# Patient Record
Sex: Female | Born: 1935 | Race: White | Hispanic: No | Smoking: Former smoker
Health system: Southern US, Community
[De-identification: ages and names within clinical notes are randomized; demographics above are authoritative.]

## PROBLEM LIST (undated history)

## (undated) DIAGNOSIS — R519 Headache, unspecified: Secondary | ICD-10-CM

## (undated) DIAGNOSIS — J45909 Unspecified asthma, uncomplicated: Secondary | ICD-10-CM

## (undated) DIAGNOSIS — R51 Headache: Secondary | ICD-10-CM

## (undated) DIAGNOSIS — I2510 Atherosclerotic heart disease of native coronary artery without angina pectoris: Secondary | ICD-10-CM

## (undated) DIAGNOSIS — I340 Nonrheumatic mitral (valve) insufficiency: Secondary | ICD-10-CM

## (undated) DIAGNOSIS — E785 Hyperlipidemia, unspecified: Secondary | ICD-10-CM

## (undated) DIAGNOSIS — G8929 Other chronic pain: Secondary | ICD-10-CM

## (undated) DIAGNOSIS — I10 Essential (primary) hypertension: Secondary | ICD-10-CM

## (undated) DIAGNOSIS — I639 Cerebral infarction, unspecified: Secondary | ICD-10-CM

## (undated) HISTORY — DX: Essential (primary) hypertension: I10

## (undated) HISTORY — PX: BACK SURGERY: SHX140

## (undated) HISTORY — DX: Other chronic pain: G89.29

## (undated) HISTORY — DX: Headache: R51

## (undated) HISTORY — DX: Atherosclerotic heart disease of native coronary artery without angina pectoris: I25.10

## (undated) HISTORY — PX: ROTATOR CUFF REPAIR: SHX139

## (undated) HISTORY — DX: Nonrheumatic mitral (valve) insufficiency: I34.0

## (undated) HISTORY — DX: Headache, unspecified: R51.9

## (undated) HISTORY — DX: Cerebral infarction, unspecified: I63.9

## (undated) HISTORY — DX: Unspecified asthma, uncomplicated: J45.909

## (undated) HISTORY — DX: Hyperlipidemia, unspecified: E78.5

---
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
## 1971-04-23 HISTORY — PX: PARTIAL HYSTERECTOMY: SHX80

## 1992-04-22 HISTORY — PX: CHOLECYSTECTOMY: SHX55

## 1997-12-12 ENCOUNTER — Ambulatory Visit (HOSPITAL_BASED_OUTPATIENT_CLINIC_OR_DEPARTMENT_OTHER): Admission: RE | Admit: 1997-12-12 | Discharge: 1997-12-12 | Payer: Self-pay | Admitting: Orthopedic Surgery

## 1998-09-19 ENCOUNTER — Encounter: Payer: Self-pay | Admitting: Neurosurgery

## 1998-09-21 ENCOUNTER — Inpatient Hospital Stay (HOSPITAL_COMMUNITY): Admission: RE | Admit: 1998-09-21 | Discharge: 1998-09-22 | Payer: Self-pay | Admitting: Neurosurgery

## 1998-09-21 ENCOUNTER — Encounter: Payer: Self-pay | Admitting: Neurosurgery

## 1998-09-26 ENCOUNTER — Encounter: Payer: Self-pay | Admitting: Neurosurgery

## 1998-09-26 ENCOUNTER — Inpatient Hospital Stay (HOSPITAL_COMMUNITY): Admission: AD | Admit: 1998-09-26 | Discharge: 1998-09-29 | Payer: Self-pay | Admitting: Neurosurgery

## 1998-09-28 ENCOUNTER — Encounter: Payer: Self-pay | Admitting: Neurosurgery

## 1999-07-06 ENCOUNTER — Encounter: Admission: RE | Admit: 1999-07-06 | Discharge: 1999-10-02 | Payer: Self-pay | Admitting: Anesthesiology

## 1999-10-02 ENCOUNTER — Encounter: Admission: RE | Admit: 1999-10-02 | Discharge: 1999-12-31 | Payer: Self-pay | Admitting: Anesthesiology

## 2000-01-01 ENCOUNTER — Encounter: Admission: RE | Admit: 2000-01-01 | Discharge: 2000-03-31 | Payer: Self-pay | Admitting: Anesthesiology

## 2000-10-06 ENCOUNTER — Ambulatory Visit (HOSPITAL_COMMUNITY): Admission: RE | Admit: 2000-10-06 | Discharge: 2000-10-06 | Payer: Self-pay | Admitting: *Deleted

## 2000-10-06 ENCOUNTER — Encounter: Payer: Self-pay | Admitting: *Deleted

## 2000-11-25 ENCOUNTER — Ambulatory Visit (HOSPITAL_COMMUNITY): Admission: RE | Admit: 2000-11-25 | Discharge: 2000-11-25 | Payer: Self-pay | Admitting: Ophthalmology

## 2001-01-09 ENCOUNTER — Encounter: Payer: Self-pay | Admitting: Emergency Medicine

## 2001-01-09 ENCOUNTER — Emergency Department (HOSPITAL_COMMUNITY): Admission: EM | Admit: 2001-01-09 | Discharge: 2001-01-09 | Payer: Self-pay | Admitting: Emergency Medicine

## 2001-02-03 ENCOUNTER — Ambulatory Visit (HOSPITAL_COMMUNITY): Admission: RE | Admit: 2001-02-03 | Discharge: 2001-02-03 | Payer: Self-pay | Admitting: Ophthalmology

## 2001-04-02 ENCOUNTER — Ambulatory Visit (HOSPITAL_COMMUNITY): Admission: RE | Admit: 2001-04-02 | Discharge: 2001-04-02 | Payer: Self-pay | Admitting: Cardiology

## 2001-05-02 ENCOUNTER — Emergency Department (HOSPITAL_COMMUNITY): Admission: EM | Admit: 2001-05-02 | Discharge: 2001-05-02 | Payer: Self-pay | Admitting: Emergency Medicine

## 2001-05-20 ENCOUNTER — Encounter: Payer: Self-pay | Admitting: Family Medicine

## 2001-05-20 ENCOUNTER — Ambulatory Visit (HOSPITAL_COMMUNITY): Admission: RE | Admit: 2001-05-20 | Discharge: 2001-05-20 | Payer: Self-pay | Admitting: Family Medicine

## 2001-05-26 ENCOUNTER — Ambulatory Visit (HOSPITAL_COMMUNITY): Admission: RE | Admit: 2001-05-26 | Discharge: 2001-05-26 | Payer: Self-pay | Admitting: Family Medicine

## 2001-05-26 ENCOUNTER — Encounter: Payer: Self-pay | Admitting: Family Medicine

## 2001-06-10 ENCOUNTER — Ambulatory Visit (HOSPITAL_COMMUNITY): Admission: RE | Admit: 2001-06-10 | Discharge: 2001-06-10 | Payer: Self-pay | Admitting: Internal Medicine

## 2001-08-03 ENCOUNTER — Ambulatory Visit (HOSPITAL_COMMUNITY): Admission: RE | Admit: 2001-08-03 | Discharge: 2001-08-03 | Payer: Self-pay | Admitting: Internal Medicine

## 2001-08-03 ENCOUNTER — Encounter (INDEPENDENT_AMBULATORY_CARE_PROVIDER_SITE_OTHER): Payer: Self-pay | Admitting: Internal Medicine

## 2001-10-13 ENCOUNTER — Encounter: Payer: Self-pay | Admitting: Family Medicine

## 2001-10-13 ENCOUNTER — Ambulatory Visit (HOSPITAL_COMMUNITY): Admission: RE | Admit: 2001-10-13 | Discharge: 2001-10-13 | Payer: Self-pay | Admitting: Family Medicine

## 2002-02-05 ENCOUNTER — Emergency Department (HOSPITAL_COMMUNITY): Admission: EM | Admit: 2002-02-05 | Discharge: 2002-02-05 | Payer: Self-pay | Admitting: Internal Medicine

## 2002-02-12 ENCOUNTER — Emergency Department (HOSPITAL_COMMUNITY): Admission: EM | Admit: 2002-02-12 | Discharge: 2002-02-12 | Payer: Self-pay | Admitting: Emergency Medicine

## 2002-02-12 ENCOUNTER — Encounter: Payer: Self-pay | Admitting: Emergency Medicine

## 2002-02-12 ENCOUNTER — Inpatient Hospital Stay (HOSPITAL_COMMUNITY): Admission: EM | Admit: 2002-02-12 | Discharge: 2002-02-16 | Payer: Self-pay | Admitting: *Deleted

## 2002-02-13 ENCOUNTER — Encounter: Payer: Self-pay | Admitting: *Deleted

## 2002-10-26 ENCOUNTER — Emergency Department (HOSPITAL_COMMUNITY): Admission: EM | Admit: 2002-10-26 | Discharge: 2002-10-26 | Payer: Self-pay | Admitting: *Deleted

## 2002-10-26 ENCOUNTER — Encounter: Payer: Self-pay | Admitting: *Deleted

## 2002-11-25 ENCOUNTER — Inpatient Hospital Stay (HOSPITAL_COMMUNITY): Admission: AD | Admit: 2002-11-25 | Discharge: 2002-11-30 | Payer: Self-pay | Admitting: Family Medicine

## 2002-11-25 ENCOUNTER — Encounter: Payer: Self-pay | Admitting: Family Medicine

## 2002-11-26 ENCOUNTER — Encounter: Payer: Self-pay | Admitting: Family Medicine

## 2002-11-30 ENCOUNTER — Inpatient Hospital Stay: Admission: AD | Admit: 2002-11-30 | Discharge: 2002-12-08 | Payer: Self-pay | Admitting: Family Medicine

## 2003-06-19 ENCOUNTER — Emergency Department (HOSPITAL_COMMUNITY): Admission: EM | Admit: 2003-06-19 | Discharge: 2003-06-19 | Payer: Self-pay | Admitting: Emergency Medicine

## 2003-06-20 ENCOUNTER — Emergency Department (HOSPITAL_COMMUNITY): Admission: EM | Admit: 2003-06-20 | Discharge: 2003-06-20 | Payer: Self-pay | Admitting: Emergency Medicine

## 2003-06-30 ENCOUNTER — Emergency Department (HOSPITAL_COMMUNITY): Admission: EM | Admit: 2003-06-30 | Discharge: 2003-06-30 | Payer: Self-pay | Admitting: Emergency Medicine

## 2003-07-01 ENCOUNTER — Emergency Department (HOSPITAL_COMMUNITY): Admission: EM | Admit: 2003-07-01 | Discharge: 2003-07-01 | Payer: Self-pay | Admitting: Emergency Medicine

## 2003-07-03 ENCOUNTER — Inpatient Hospital Stay (HOSPITAL_COMMUNITY): Admission: EM | Admit: 2003-07-03 | Discharge: 2003-07-11 | Payer: Self-pay | Admitting: Emergency Medicine

## 2003-07-11 ENCOUNTER — Inpatient Hospital Stay: Admission: AD | Admit: 2003-07-11 | Discharge: 2003-07-15 | Payer: Self-pay | Admitting: Family Medicine

## 2003-07-15 ENCOUNTER — Inpatient Hospital Stay (HOSPITAL_COMMUNITY): Admission: EM | Admit: 2003-07-15 | Discharge: 2003-07-19 | Payer: Self-pay | Admitting: Emergency Medicine

## 2003-07-19 ENCOUNTER — Inpatient Hospital Stay: Admission: AD | Admit: 2003-07-19 | Discharge: 2003-07-27 | Payer: Self-pay | Admitting: Family Medicine

## 2003-08-08 ENCOUNTER — Emergency Department (HOSPITAL_COMMUNITY): Admission: EM | Admit: 2003-08-08 | Discharge: 2003-08-08 | Payer: Self-pay | Admitting: Emergency Medicine

## 2003-08-15 ENCOUNTER — Inpatient Hospital Stay (HOSPITAL_COMMUNITY): Admission: EM | Admit: 2003-08-15 | Discharge: 2003-08-17 | Payer: Self-pay | Admitting: Emergency Medicine

## 2003-11-21 ENCOUNTER — Ambulatory Visit (HOSPITAL_COMMUNITY): Admission: RE | Admit: 2003-11-21 | Discharge: 2003-11-21 | Payer: Self-pay | Admitting: Family Medicine

## 2004-02-10 ENCOUNTER — Ambulatory Visit (HOSPITAL_COMMUNITY): Admission: RE | Admit: 2004-02-10 | Discharge: 2004-02-10 | Payer: Self-pay | Admitting: Internal Medicine

## 2004-02-27 ENCOUNTER — Ambulatory Visit: Payer: Self-pay | Admitting: Urgent Care

## 2004-04-06 ENCOUNTER — Emergency Department (HOSPITAL_COMMUNITY): Admission: EM | Admit: 2004-04-06 | Discharge: 2004-04-07 | Payer: Self-pay | Admitting: Emergency Medicine

## 2004-05-26 ENCOUNTER — Emergency Department (HOSPITAL_COMMUNITY): Admission: EM | Admit: 2004-05-26 | Discharge: 2004-05-26 | Payer: Self-pay | Admitting: Emergency Medicine

## 2004-06-06 ENCOUNTER — Ambulatory Visit: Payer: Self-pay | Admitting: Internal Medicine

## 2005-01-10 IMAGING — CR DG CHEST 1V PORT
1 series · 1 of 1 positions shown · non-contrast
Comparison: none

CLINICAL DATA: Chest pain.
 PORTABLE CHEST ONE VIEW
 Portable exam, 0547 hours compared to 07/03/03.  Normal heart size, mediastinal contours, and vascularity.  No acute infiltrate, effusion, or mass.  Mild calcification and elongation of aorta.  Degenerative changes left AC joint. 
 IMPRESSION
 No acute abnormalities.

[view not recorded]
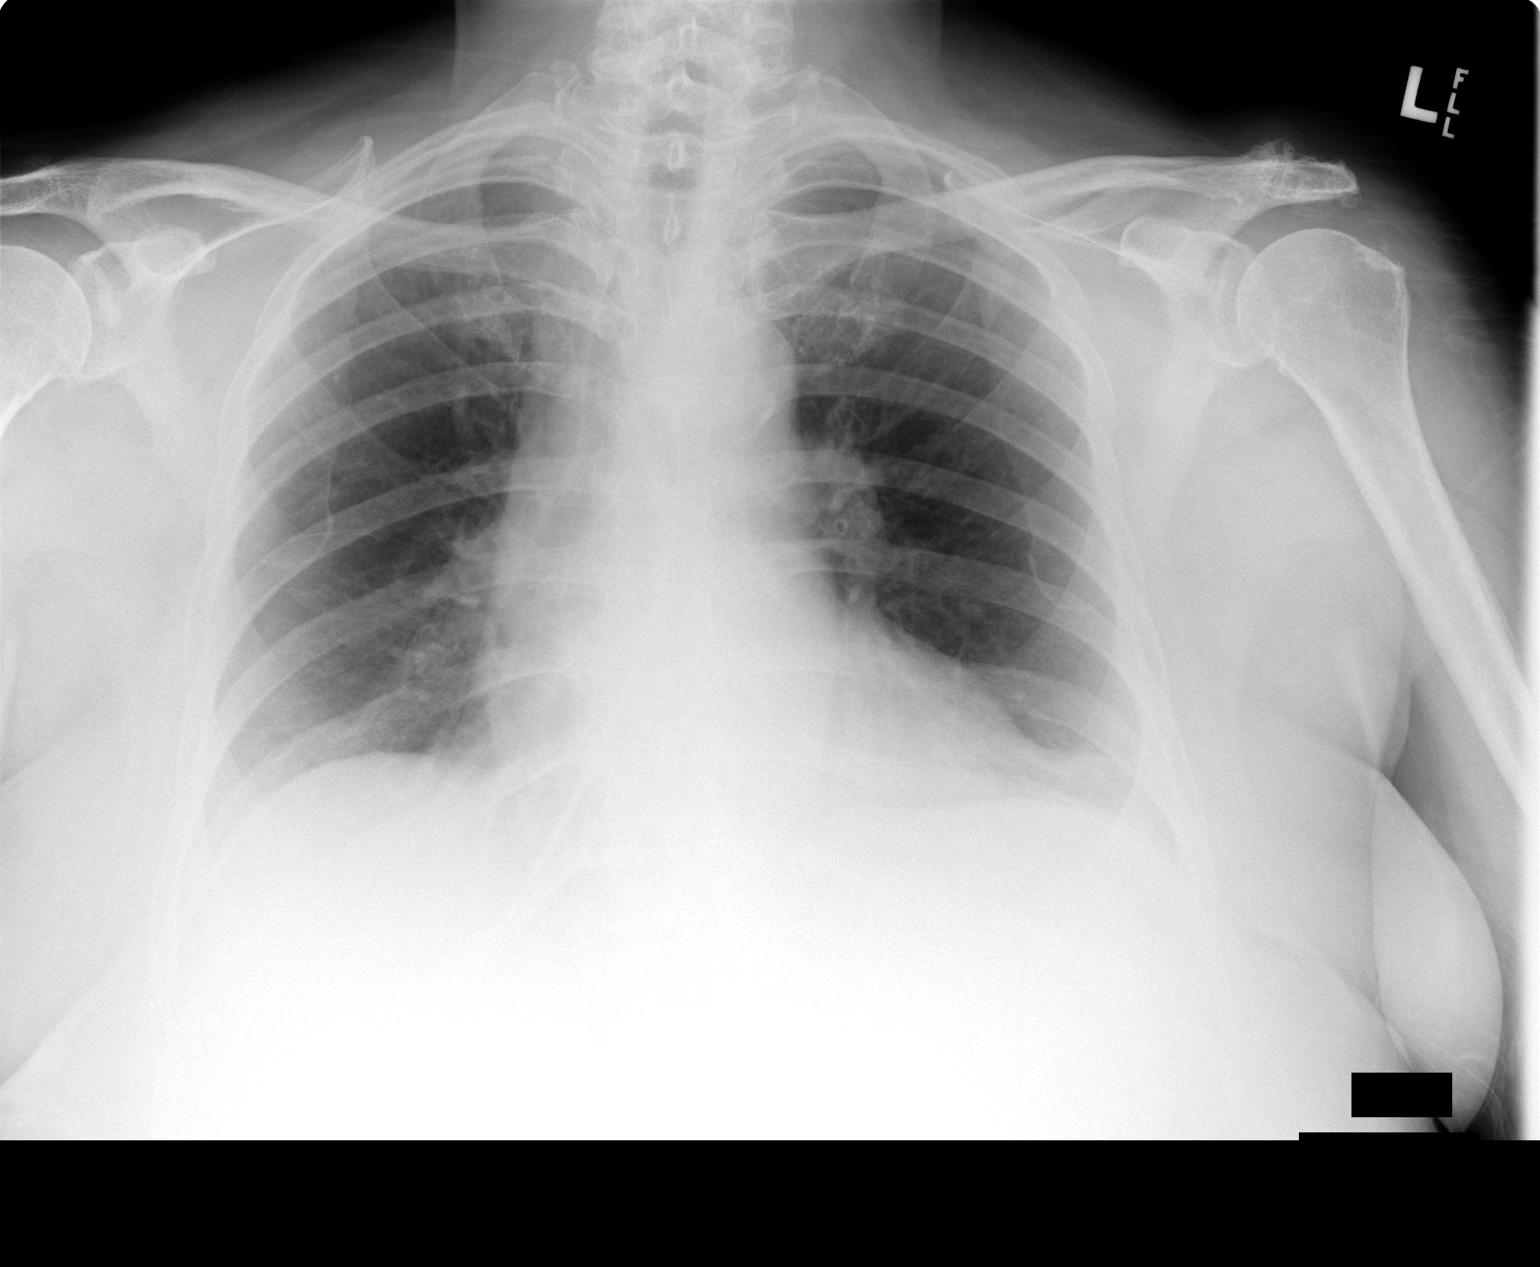

[1 of 1 positions shown; findings below may reference images not displayed]

## 2005-01-16 ENCOUNTER — Ambulatory Visit: Payer: Self-pay | Admitting: Cardiology

## 2005-01-23 ENCOUNTER — Encounter (HOSPITAL_COMMUNITY): Admission: RE | Admit: 2005-01-23 | Discharge: 2005-02-22 | Payer: Self-pay | Admitting: Cardiology

## 2005-01-23 ENCOUNTER — Ambulatory Visit: Payer: Self-pay | Admitting: *Deleted

## 2005-02-06 ENCOUNTER — Ambulatory Visit: Payer: Self-pay | Admitting: *Deleted

## 2006-01-24 ENCOUNTER — Ambulatory Visit (HOSPITAL_COMMUNITY): Admission: RE | Admit: 2006-01-24 | Discharge: 2006-01-24 | Payer: Self-pay | Admitting: Family Medicine

## 2006-04-18 ENCOUNTER — Ambulatory Visit: Payer: Self-pay | Admitting: Cardiology

## 2006-05-21 ENCOUNTER — Ambulatory Visit: Payer: Self-pay | Admitting: Internal Medicine

## 2006-05-26 ENCOUNTER — Ambulatory Visit (HOSPITAL_COMMUNITY): Admission: RE | Admit: 2006-05-26 | Discharge: 2006-05-26 | Payer: Self-pay | Admitting: Internal Medicine

## 2006-07-31 ENCOUNTER — Ambulatory Visit: Payer: Self-pay | Admitting: Internal Medicine

## 2006-10-14 ENCOUNTER — Ambulatory Visit: Payer: Self-pay | Admitting: Internal Medicine

## 2006-10-16 ENCOUNTER — Encounter: Payer: Self-pay | Admitting: Internal Medicine

## 2006-10-16 ENCOUNTER — Ambulatory Visit (HOSPITAL_COMMUNITY): Admission: RE | Admit: 2006-10-16 | Discharge: 2006-10-16 | Payer: Self-pay | Admitting: Internal Medicine

## 2006-10-16 ENCOUNTER — Ambulatory Visit: Payer: Self-pay | Admitting: Internal Medicine

## 2007-04-14 ENCOUNTER — Ambulatory Visit: Payer: Self-pay | Admitting: Cardiology

## 2007-04-27 ENCOUNTER — Ambulatory Visit: Payer: Self-pay

## 2007-07-30 ENCOUNTER — Ambulatory Visit (HOSPITAL_COMMUNITY): Admission: RE | Admit: 2007-07-30 | Discharge: 2007-07-30 | Payer: Self-pay | Admitting: Family Medicine

## 2008-05-17 ENCOUNTER — Ambulatory Visit (HOSPITAL_COMMUNITY): Admission: RE | Admit: 2008-05-17 | Discharge: 2008-05-17 | Payer: Self-pay | Admitting: Family Medicine

## 2008-05-19 ENCOUNTER — Ambulatory Visit: Payer: Self-pay | Admitting: Cardiology

## 2009-07-18 DIAGNOSIS — I10 Essential (primary) hypertension: Secondary | ICD-10-CM | POA: Insufficient documentation

## 2009-07-18 DIAGNOSIS — E785 Hyperlipidemia, unspecified: Secondary | ICD-10-CM

## 2009-07-18 DIAGNOSIS — I080 Rheumatic disorders of both mitral and aortic valves: Secondary | ICD-10-CM | POA: Insufficient documentation

## 2009-07-18 DIAGNOSIS — I2510 Atherosclerotic heart disease of native coronary artery without angina pectoris: Secondary | ICD-10-CM | POA: Insufficient documentation

## 2009-07-20 ENCOUNTER — Ambulatory Visit: Payer: Self-pay | Admitting: Cardiology

## 2009-07-20 DIAGNOSIS — R0602 Shortness of breath: Secondary | ICD-10-CM

## 2009-12-29 ENCOUNTER — Encounter (INDEPENDENT_AMBULATORY_CARE_PROVIDER_SITE_OTHER): Payer: Self-pay

## 2010-02-26 ENCOUNTER — Encounter: Payer: Self-pay | Admitting: Cardiology

## 2010-03-08 ENCOUNTER — Encounter: Payer: Self-pay | Admitting: Cardiology

## 2010-03-09 ENCOUNTER — Encounter: Payer: Self-pay | Admitting: Cardiology

## 2010-03-12 ENCOUNTER — Telehealth: Payer: Self-pay | Admitting: Cardiology

## 2010-04-02 ENCOUNTER — Encounter: Payer: Self-pay | Admitting: Cardiology

## 2010-05-12 ENCOUNTER — Encounter: Payer: Self-pay | Admitting: *Deleted

## 2010-05-12 ENCOUNTER — Encounter: Payer: Self-pay | Admitting: Family Medicine

## 2010-05-13 ENCOUNTER — Encounter: Payer: Self-pay | Admitting: Family Medicine

## 2010-05-14 ENCOUNTER — Inpatient Hospital Stay: Admission: RE | Admit: 2010-05-14 | Payer: Self-pay | Source: Home / Self Care | Admitting: Orthopedic Surgery

## 2010-05-22 NOTE — Letter (Signed)
Summary: Recall, Screening Colonoscopy Only  [facility]  [address]   [place], [place] [address]   Phone: [phone]  Fax: [phone]    December 29, 2009  [name] [address] [place], [place]  [address] November 07, 1925   Dear [name],   Our records indicate it is time to schedule your colonoscopy.    Please call our office at [phone] and ask for the nurse.   Thank you,  Hendricks Limes, LPN Cloria Spring, LPN  [facility] Ph: [phone]   Fax: [phone]

## 2010-05-22 NOTE — Progress Notes (Signed)
Summary: status of clearance  Phone Note From Other Clinic   Caller: kristien pa. [phone] Request: Talk with Nurse Details of Complaint: pls faxed clearance over for total knee replacement. is patient a good candidate or not. fax  attention kathy kochell - [phone] . Initial call taken by: Lorne Skeens,  March 12, 2010 3:15 PM  Follow-up for Phone Call        Left message information was refaxed today. Mylo Red RN

## 2010-05-22 NOTE — Letter (Signed)
Summary: [facility]  [facility]   Imported By: Marylou Mccoy 03/22/2010 15:11:41  _____________________________________________________________________  External Attachment:    Type:   Image     Comment:   External Document

## 2010-05-22 NOTE — Assessment & Plan Note (Signed)
Summary: ROV/ DYSPENA/ GD   Visit Type:  rov Primary Provider:  cresenzo  CC:  sob and palps.  History of Present Illness: [name] comes in today because of a chief complaint of dyspnea on exertion. She's had this for a number of years but has gotten worse. She says she can barely suite without getting short of breath.  She describes occasionally is short of breath at night. It responds to her inhaler.  She denies any edema, orthopnea per se, PND. She has a lot of trouble walking around with her chronic orthopedic issues.  Current Medications (verified): 1)  Furosemide 40 Mg Tabs (Furosemide) .... 1/2 By Mouth Daily and As Needed 2)  Klor-Con M20 20 Meq Cr-Tabs (Potassium Chloride Crys Cr) .Marland Kitchen.. 1 By Mouth Daily 3)  Losartan Potassium 50 Mg Tabs (Losartan Potassium) .Marland Kitchen.. 1 By Mouth Daiy 4)  Alprazolam 2 Mg Tabs (Alprazolam) .... As Directed 5)  Protegra .Marland Kitchen.. 1 By Mouth Daily 6)  Amlodipine Besylate 2.5 Mg Tabs (Amlodipine Besylate) .Marland Kitchen.. 1 By Mouth Daily 7)  Protonix 40 Mg Tbec (Pantoprazole Sodium) .Marland Kitchen.. 1 By Mouth Daily 8)  Bystolic 5 Mg Tabs (Nebivolol Hcl) .Marland Kitchen.. 1 By Mouth Daily 9)  Tylenol Arthritis Pain 650 Mg Cr-Tabs (Acetaminophen) .... As Needed 10)  Cod Liver Oil  Caps (Cod Liver Oil) .Marland Kitchen.. 1 By Mouth Daily 11)  Aspirin 81 Mg  Tabs (Aspirin) .Marland Kitchen.. 1 By Mouth Daily 12)  Humulin 70/30 70-30 % Susp (Insulin Isophane & Regular) .... As Directed 13)  Humalog 100 Unit/ml Soln (Insulin Lispro (Human)) .... As Directed 14)  Albuterol Sulfate (2.5 Mg/28ml) 0.083% Nebu (Albuterol Sulfate) .... As Directed  Allergies (verified): 1)  ! Steroids  Past History:  Past Surgical History: Last updated: 05/19/2008 Back Surgery Cholecystectomy Rotator Cuff Repair TAH and BSO  Review of Systems       negative other than history of present illness  Vital Signs:  Patient profile:   77 year old female Height:      61 inches Weight:      176 pounds BMI:     33.38 Pulse rate:   79 /  minute Resp:     16 per minute BP sitting:   122 / 68  (left arm)  Vitals Entered By: Marrion Coy, CNA (July 20, 2009 12:42 PM)  Physical Exam  General:  obese.  using a cane, Slow-K Head:  normocephalic and atraumatic Eyes:  PERRLA/EOM intact; conjunctiva and lids normal. Neck:  Neck supple, no JVD. No masses, thyromegaly or abnormal cervical nodes. Chest Loras Grieshop:  no deformities or breast masses noted Lungs:  clear to auscultation percussion with no rales rhonchi or wheezes Heart:  poorly appreciated PMI, normal S1-S2, no gallop. Very soft murmur at the apex. Msk:  decreased ROM.   Pulses:  pulses normal in all 4 extremities Extremities:  trace left pedal edema and trace right pedal edema.   Neurologic:  Alert and oriented x 3. Skin:  Intact without lesions or rashes. Psych:  Normal affect.   Problems:  Medical Problems Added: 1)  Dx of Dyspnea  (ICD-786.05)  EKG  Procedure date:  07/20/2009  Findings:      normal sinus rhythm, low voltage QRS. No changes  Impression & Recommendations:  Problem # 1:  DYSPNEA (ICD-786.05) Assessment Deteriorated  I have explained to her son that her dyspnea is multifactorial. We'll try to decrease her resting heart rate by increasing her Bystolic from 5 mg to 10 mg. I think  she'll still be short of breath. That is contributing include age, awake, diastolic dysfunction, orthopedic issues with increased work to walk, and COPD. Her updated medication list for this problem includes:    Furosemide 40 Mg Tabs (Furosemide) .Marland Kitchen... 1/2 by mouth daily and as needed    Losartan Potassium 50 Mg Tabs (Losartan potassium) .Marland Kitchen... 1 by mouth daiy    Amlodipine Besylate 2.5 Mg Tabs (Amlodipine besylate) .Marland Kitchen... 1 by mouth daily    Bystolic 10 Mg Tabs (Nebivolol hcl) .Marland Kitchen... 1 once daily    Aspirin 81 Mg Tabs (Aspirin) .Marland Kitchen... 1 by mouth daily  Orders: EKG w/ Interpretation (93000)  Problem # 2:  MITRAL REGURGITATION, 0 (MILD) (ICD-396.3) Assessment:  Unchanged  Problem # 3:  CAD, NATIVE VESSEL (ICD-414.01) Assessment: Unchanged  Her updated medication list for this problem includes:    Amlodipine Besylate 2.5 Mg Tabs (Amlodipine besylate) .Marland Kitchen... 1 by mouth daily    Bystolic 10 Mg Tabs (Nebivolol hcl) .Marland Kitchen... 1 once daily    Aspirin 81 Mg Tabs (Aspirin) .Marland Kitchen... 1 by mouth daily  Problem # 4:  HYPERTENSION (ICD-401.9) Assessment: Improved  Her updated medication list for this problem includes:    Furosemide 40 Mg Tabs (Furosemide) .Marland Kitchen... 1/2 by mouth daily and as needed    Losartan Potassium 50 Mg Tabs (Losartan potassium) .Marland Kitchen... 1 by mouth daiy    Amlodipine Besylate 2.5 Mg Tabs (Amlodipine besylate) .Marland Kitchen... 1 by mouth daily    Bystolic 10 Mg Tabs (Nebivolol hcl) .Marland Kitchen... 1 once daily    Aspirin 81 Mg Tabs (Aspirin) .Marland Kitchen... 1 by mouth daily  Patient Instructions: 1)  Your physician recommends that you schedule a follow-up appointment in: YEAR WITH DR Jorden Minchey 2)  Your physician has recommended you make the following change in your medication: INCREASE BYSTOLIC 10 MG EVERY DAY  Prescriptions: BYSTOLIC 10 MG TABS (NEBIVOLOL HCL) 1 once daily  #90 x 3   Entered by:   Scherrie Bateman, LPN   Authorized by:   Gaylord Shih, MD, [facility]   Signed by:   Scherrie Bateman, LPN on 16/01/9603   Method used:   Electronically to        [facility] (retail)       [address]       [place], [place]  [address]       Ph: [phone]       Fax: [phone]   RxID:   [id number]

## 2010-05-22 NOTE — Letter (Signed)
Summary: Clearance Letter  [facility], Main Office  [address]   [place], [place] [address]   Phone: [phone]  Fax: [phone]    March 08, 2010  Re:     [facility] Address:   [address]     [place], [place]  [address] DOB:     10-Aug-1925 MRN:     [id number]   Dear Dr. Thurston Hole     [name] at moderate rish cardiac wise for surgery.  Please see attached office visit notes.  If you have any questions please do not hesitate to call us at [phone].           Sincerely,     Dr. Valera Castle Lisabeth Devoid RN

## 2010-05-24 NOTE — Letter (Signed)
Summary: [facility] Office Note   [facility] Office Note   Imported By: Roderic Ovens 04/03/2010 10:45:43  _____________________________________________________________________  External Attachment:    Type:   Image     Comment:   External Document

## 2010-05-24 NOTE — Letter (Signed)
Summary: PRE-OP REFFERAL  PRE-OP REFFERAL   Imported By: Faythe Ghee 04/02/2010 10:43:15  _____________________________________________________________________  External Attachment:    Type:   Image     Comment:   External Document

## 2010-05-24 NOTE — Letter (Signed)
Summary: CLEARANCE  CLEARANCE   Imported By: Faythe Ghee 04/02/2010 10:44:16  _____________________________________________________________________  External Attachment:    Type:   Image     Comment:   External Document

## 2010-09-04 NOTE — Assessment & Plan Note (Signed)
[facility]                       [place] [facility] OFFICE NOTE   NAME:[name], [name]                   MRN:          [id number]  DATE:05/19/2008                            DOB:          [birth year]    [name] comes in today for followup.  She says she is 77 years old,  but does not look a minute over 72.   She lives by herself and is extremely independent.  She takes care of  all her personal needs, including her medical needs.  She is very  compliant.   PROBLEM LIST:  1. Nonobstructive coronary artery disease.  Her last stress Myoview      was last year on April 27, 2007, which showed no ischemia, normal      contractility, and thickening all areas of myocardium, EF 81%.  2. Mild aortic root dilatation.  3. Hypertension.  4. Hyperlipidemia.  5. Mild mitral regurgitation.   She could not tolerate Lipitor because of muscle aches in her legs.  She  is off this medication at present.  Dr. Nobie Putnam is following her blood  work and told her that cholesterol was actually pretty good.   CURRENT MEDICATIONS:  1. Protegra.  2. Albuterol inhaler t.i.d.  3. Omega-3 fish oil.  4. Bisoprolol 5 mg q.a.m.  5. Xanax 2 mg p.o. t.i.d.  6. Protonix 40 mg a day.  7. Aspirin 81 mg every other day.  8. Potassium 20 mEq a day.  9. Insulin as directed.  10.Colace 2 nightly p.r.n.  11.Tylenol Arthritis b.i.d.  12.Lasix 20 mg p.o. b.i.d.  13.She carries nitroglycerin.  She has not had to use it.   PHYSICAL EXAMINATION:  VITAL SIGNS:  Her blood pressure today is 148/70,  pulse 64 and regular.  Weight is 168, up 5.  SKIN:  Warm and dry.  GENERAL:  Alert and oriented x3, unremarkable otherwise.  She looks much  younger than stated age.  NECK:  Carotid upstrokes were equal bilateral without bruits.  No JVD.  Thyroid is not enlarged.  Trachea is midline.  LUNGS:  Clear to auscultation and percussion.  HEART:  A soft S1 and S2.  PMI could not be  appreciated.  ABDOMEN  Soft, good bowel sounds.  No midline bruits.  EXTREMITIES:  There is no cyanosis, clubbing, or edema.  Pulses are  intact.  NEUROLOGIC:  Intact.   Electrocardiogram shows sinus rhythm with low-voltage poor progression  across the anterior precordium, which is stable.   ASSESSMENT AND PLAN:  [name] is doing well.  She says that she  adjust her Lasix based on the swelling she has in the morning.  I think,  this is quite reasonable.  She is on a good medical program.  It is  unfortunate she cannot take a statin.  We discussed that at length.   I will plan on seeing her back in a year.     Thomas C. Daleen Squibb, MD, [facility]  Electronically Signed    TCW/MedQ  DD: 05/19/2008  DT: 05/20/2008  Job #: [id number]   cc:   Patrica Duel, M.D.

## 2010-09-04 NOTE — Op Note (Signed)
[name], [name]            ACCOUNT NO.:  [id number]   MEDICAL RECORD NO.:  [id number]          PATIENT TYPE:  AMB   LOCATION:  DAY                           FACILITY:  [facility]   PHYSICIAN:  [name], M.D. DATE OF BIRTH:  February 18, 1926   DATE OF PROCEDURE:  10/16/2006  DATE OF DISCHARGE:                               OPERATIVE REPORT   PROCEDURE PERFORMED:  Esophagogastroduodenoscopy with biopsy followed by  colonoscopy, snare polypectomy, and biopsy.   INDICATIONS FOR PROCEDURE:  80-year lady with multiple medical problems,  intermittent rectal bleeding, nonspecific abdominal pain, early satiety.  EGD and colonoscopy are now being done.  This approach has been  discussed with the patient at length.  The potential risks, benefits and  alternatives have been reviewed, questions answered, please see  documentation in the medical record.   PROCEDURE NOTE:  O2 saturation, blood pressure, pulse oximetry were  monitored throughout the entire procedure.  Conscious sedation with  Versed 5 mg IV and Demerol 125 mg IV in divided doses.  Cetacaine spray  for topical pharyngeal anesthesia.  Instrument Pentax video chip system.   ESOPHAGOGASTRODUODENOSCOPY FINDINGS:  Examination of the tubular  esophagus revealed no abnormalities.  The tubular esophagus was widely  patent.  The EG junction was easily traversed.  The gastric cavity was  empty and insufflated well with air.  A thorough examination of the  gastric mucosa including retroflexion of the proximal stomach and  esophagogastric junction demonstrated multiple fundal gland type polyps,  the largest about 8 mm. The remainder of the gastric mucosa appeared  normal.  The pylorus was patent, easily traversed.  Examination of the  bulb and second portion revealed numerous bulbar and D2 erosions.  There  were multiple. There was no frank ulcer crater, however.   THERAPEUTIC/DIAGNOSTIC MANEUVERS PERFORMED:  The largest of the polyps  were biopsied for histologic study.  The patient tolerated the procedure  well and was prepared for colonoscopy.   COLONOSCOPY FINDINGS:  Digital rectal examination revealed no  abnormalities.  The prep was adequate.  Examination of the colonic  mucosa was undertaken from the rectosigmoid junction through the left,  transverse, right colon, to the appendiceal orifice, ileocecal valve,  and cecum.  These structures were well seen and photographed for the  record.  From this level, the scope was slowly withdrawn.  All  previously mentioned mucosal surfaces were again seen.  The patient had  extensive left sided diverticula.  There was a diminutive polyp in the  base of the cecum which was cold biopsied.  The remainder of the colonic  mucosa appeared normal.  The scope was pulled down in the rectum where  thorough examination of the rectal mucosa including retroflex view of  the anal verge demonstrated a 6 mm adenomas polyp 10 cm in from the anal  verge.  This was removed with hot snare cautery and recovered through  the scope.  The remainder of the colonic mucosa appeared normal.   The patient tolerated both procedures well, was reacted in endoscopy.   IMPRESSION:  1. Normal esophagus.  Multiple fundal gland type  polyps, largest      biopsied. Otherwise, normal stomach. Erosion in the pyloric      channel, multiple erosions in D2 and D3.  Otherwise, normal gastric      mucosa.  2. Colonoscopy.  A rectal polyp removed with snare cautery.      Otherwise, negative mucosa. Left sided diverticula. Diminutive      polyp in the cecum, cold biopsy/removed.  The remainder of the      colonic mucosa appeared normal.   RECOMMENDATIONS:  1. No aspirin or arthritis medications for ten days.  2. Check H. pylori serologies.  3. Follow up on path.  4. Further recommendations to follow.      [name], M.D.  Electronically Signed     RMR/MEDQ  D:  10/16/2006  T:  10/16/2006  Job:   [id number]   cc:   [name], M.D.  Fax: [phone]   [name], M.D.  Fax: [phone]

## 2010-09-04 NOTE — H&P (Signed)
[name], [name]            ACCOUNT NO.:  [id number]   MEDICAL RECORD NO.:  [id number]          PATIENT TYPE:  AMB   LOCATION:  DAY                           FACILITY:  [facility]   PHYSICIAN:  R. Roetta Sessions, M.D. DATE OF BIRTH:  Aug 02, 1925   DATE OF ADMISSION:  10/14/2006  DATE OF DISCHARGE:  LH                              HISTORY & PHYSICAL   CHIEF COMPLAINT:  Intermittent hematochezia, early satiety, weight loss,  need colonoscopy.   HISTORY OF PRESENT ILLNESS:  [name] is a pleasant 77-year-  old Caucasian female with numerous GI complaints, has been evaluated  extensively by Dr. Loreta Ave over the years.  She recently saw Dr. Emelda Fear.  He found her to have an anterior cystocele and contemplation for  surgical repair is being made, and he wished her to have a colonoscopy  in the near future prior to surgical intervention.   It is notable this lady has been seen by Dr. Loreta Ave for gastroesophageal  reflux disease and dysphagia recently, she was last seen on 07/31/2006.  She has had some problems with various proton pump inhibitor agents.  She was started on Prevacid which helped her symptoms as good as any.  She has now come off of that agent and tells me she is watching her diet  and really not having much in the way of reflux symptoms on no acid  suppression therapy.  She also has had history dysphagia and distally  had esophageal ring dilated.  She underwent a barium pill esophagram on  05/26/2006 which demonstrated diffuse esophageal motility impairment,  relative narrowing of the EG junction, but the 12.5 mm pill traversed  this area without difficulty.   Also she describes vague intermittent abdominal pain.  She takes three  Colace tablets at night to have a bowel movement the next day.  She has  had a little blood per rectum recently.  She also describes early  satiety.  She tells me she could take 2 mouthfuls of food and she feels  full.  She has lost 10  pounds since she was seen in April of this year.  It has been a good 5 years since she had an EGD.  She has had multiple  colonoscopies in the past.  Her last exam was actually sigmoidoscopy  coupled by barium enema around 1999.  She has a family history of colon  cancer in multiple third-degree relatives, but no first-degree relatives  with this disease although she has an older sister who had colonic  polyps.   PAST MEDICAL HISTORY:  Significant for irritable bowel syndrome, prior  distant colonoscopies and sigmoidoscopy.  ACBE in 2000 demonstrated  diverticulosis.  History of hypertension, nonobstructive coronary  disease, type 2 diabetes mellitus now on insulin, history of bilateral  cataract surgery, chronic low back pain.  She gives a history of having  a surgery at [facility] years ago for removal of a benign mass  adherent to the bladder and colon at that time.  She is also status post  L1 through L5 multilevel lumbar laminectomy for relief of multilevel  lumbar  stenosis and neurogenic claudication.  Other surgeries include  appendectomy, tonsillectomy, hysterectomy, hemorrhoidectomy,  cholecystectomy, right shoulder surgery, lumbar laminectomy, nodule  removed from vocal cords.   CURRENT MEDICATIONS:  1. Inderal 40 mg orally b.i.d.  2. Xanax 2 mg q.h.s.  3. Tylenol p.r.n.  4. Potassium supplement 20 mEq daily.  5. Lasix 20 mg p.r.n.  6. Lipitor 10 mg twice weekly.  7. Insulin 70/30 14 units in the morning, 10 units in the evening.  8. ASA 81 mg daily.  9. Colace stool softener 3 tablets at bedtime.   ALLERGIES:  PERCODAN, PERCOCET, STEROIDS.   FAMILY HISTORY:  As outlined above.   SOCIAL HISTORY:  The patient is widowed.  She is retired.  She lives  alone.  She has a supportive son and one of her daughters is in town.   PHYSICAL EXAMINATION:  She is a frail elderly lady who really cannot get  up on the examining table.  She is examined in the chair.  Weight  150, height  5 feet 1 inch, temp 98.3, BP 112/70, pulse 60.  SKIN:  Warm and dry.  There is no jaundice or extending stigmata of  chronic liver disease.  HEENT EXAM:  No scleral icterus.  Conjunctivae are pink.  Oral cavity no  lesions.  CHEST:  Lungs clear to auscultation.  CARDIAC:  Reveals a regular rate and rhythm without murmur, gallop or  rub.  ABDOMEN:  Symmetrical, nondistended.  Positive bowel sounds, soft, no  obvious mass or organomegaly.  She has 1+ lower extremity edema.  RECTAL EXAM:  Deferred to time of colonoscopy.   IMPRESSION:  [name] is a 77 year old lady with multiple  medical problems, who now has an anterior cystocele and there is now  contemplation for surgical repair.  She has a tendency to constipation  and has had some intermittent rectal bleeding.  She has some nonspecific  abdominal pain.  She has a long history of esophageal motility disorder  and gastroesophageal reflux disease along with dysphagia.  The latter  symptoms have become quiescent over the past couple of months without  any specific therapy.   She does, however, now complain of prominent symptoms of early satiety  in the setting of a notable weight loss.   RECOMMENDATIONS:  I agree with Dr. Emelda Fear she ought to go ahead have a  colonoscopy now for largely diagnostic purpose, given her symptoms.  I  have also offered Ms. Pollok a diagnostic EGD at the same time to  further evaluate early satiety.  Potential risks, benefits and  alternatives have been reviewed, questions answered.  She is agreeable.  We will make further recommendations in the very near future.      Jonathon Bellows, M.D.  Electronically Signed     RMR/MEDQ  D:  10/14/2006  T:  10/14/2006  Job:  [id number]   cc:   Patrica Duel, M.D.  Fax: [phone]   Tilda Burrow, M.D.  Fax: [phone]

## 2010-09-04 NOTE — Assessment & Plan Note (Signed)
[facility]                            CARDIOLOGY OFFICE NOTE   NAME:[name], [name]                   MRN:          [id number]  DATE:04/14/2007                            DOB:          December 10, 1925    [name] returns today for further management of the following issues:  1. Non-obstructive coronary disease.  I saw her last year, April 18, 2006, and I thought she was having gastroesophageal discomfort.      She has had endoscopy and colonoscopy this past summer with Dr.      Jena Gauss, who found some gastritis.  He cut her aspirin to 81 mg every      other day.  2. Mild aortic root dilatation.  3. Hypertension.  4. Hyperlipidemia.  5. Mild mitral regurgitation.   She has been having some shortness of breath, particularly with  exertion.  She has not taken any nitroglycerin.  She is also concerned  about Lipitor causing aches in her legs.  It sounds like this is mostly  arthritis.   MEDICATIONS:  1. Lipitor 10 mg every other day.  2. Lasix 10 mg b.i.d.  3. Librax 5/2.5 b.i.d.  4. Inderal 40 mg p.o. b.i.d.  5. Insulin as directed.  6. K-Dur 20 mEq daily.  7. Aspirin 81 mg daily.  8. Protonix 40 mg a day.  9. Xanax 2 mg b.i.d.   PHYSICAL EXAMINATION:  VITAL SIGNS:  Her blood pressure today is 131/60,  pulse 68 and regular, weight 163.  HEENT:  Normocephalic and atraumatic.  Pupils are equal, round and  reactive to light and accommodation.  Extraocular movements intact.  Sclerae are clear.  Facial symmetry is normal.  NECK:  Carotid upstrokes are equal bilaterally without bruits.  No JVD.  Thyroid is not enlarged.  Trachea is midline.  LUNGS:  Clear.  HEART:  Poorly appreciated PMI.  Normal S1 and S2 without gallop.  I  hear no significant murmur.  ABDOMEN:  Obese with good bowel sounds.  Organomegaly cannot be  assessed.  EXTREMITIES:  No edema.  Pulses are intact.  She has some varicose  veins.  No sign of DVT.  NEUROLOGIC:   Intact.   ELECTROCARDIOGRAM:  EKG shows sinus rhythm with left axis deviation,  poor R wave progression across the anterior precordium.  This has not  changed since her last EKG.   I had a nice chat with [name].  I have advised her to stay on the  Lipitor.  I have also advised her to have an adenosine rest/stress  Myoview to evaluate her dyspnea on exertion and progressive coronary  disease.  If this is negative for ischemia, will see her back in a year.     Thomas C. Daleen Squibb, MD, [facility]  Electronically Signed    TCW/MedQ  DD: 04/14/2007  DT: 04/15/2007  Job #: [id number]   cc:   Patrica Duel, M.D.

## 2010-09-07 NOTE — Cardiovascular Report (Signed)
NAME:  [name], [name]                      ACCOUNT NO.:  [id number]   MEDICAL RECORD NO.:  [id number]                   PATIENT TYPE:  INP   LOCATION:  4727                                 FACILITY:  [facility]   PHYSICIAN:  Arturo Morton. Riley Kill, M.D. [facility]         DATE OF BIRTH:  Feb 11, 1926   DATE OF PROCEDURE:  02/15/2002  DATE OF DISCHARGE:                              CARDIAC CATHETERIZATION   INDICATIONS FOR PROCEDURE:  The patient is a 77 year old lady who has  recently had some chest pain.  A Cardiolite was equivocal in the anterior  segment.  The current study was done to assess coronary anatomy.   PROCEDURE:  1. Left heart catheterization.  2. Selective coronary arteriography.  3. Selective left ventriculography.   DESCRIPTION OF PROCEDURE:  The procedure was performed from the right  femoral artery using #6 French catheters.  She tolerated the procedure  without complication.  I reviewed the films with Dr. Geralynn Rile.  We  elected to take her off the table at that point and I will get other  opinions.   HEMODYNAMIC DATA:  1. The central aortic pressure was 161/68 with a mean of 112.  2. Left ventricular pressure was 158/19.  3. There was no gradient on pullback across the aortic valve.   ANGIOGRAPHIC DATA:  1. Ventriculography in the RAO projection reveals vigorous global systolic     function.  No segmental abnormalities, contraction were identified.  2. The left main coronary artery is free of critical disease.  3. The left anterior descending artery has a focal lesion just after the     septal perforator.  This measured about 60-70% in luminal reduction.  It     appears slightly more severe in a straight RAO AP projection but in     multiple other views did not appear to be severe.  In the mid vessel,     there is about 40% narrowing; then there is 60% narrowing after a small     diagonal.  4. The circumflex provides a bifurcating marginal and there is about 50%   narrowing in the mid circumflex.  5. The right coronary artery has about 40% ostial narrowing but does not     appear to be flow-limiting.  There is a posterior descending and     posterolateral system which are free of critical disease.   CONCLUSION:  1. Normal left ventricular function with, at most, trace mitral     regurgitation.  2. A 60-70% mid left anterior descending artery stenosis of uncertain     significance.  3. Other lesions as noted above.    DISPOSITION:  I plan to review the films with my colleagues.  A final  decision will be made regarding either medical therapy or implantation of a  drug-eluting stent in the mid LAD.  Arturo Morton. Riley Kill, M.D. [facility]    TDS/MEDQ  D:  02/15/2002  T:  02/15/2002  Job:  [id number]   cc:   Patrica Duel, MD  [address], [address]  [place]  [place] [address]  Fax: [phone]   Jesse Sans. Wall, M.D. [facility]   Cardiac Catheterization Lab

## 2010-09-07 NOTE — H&P (Signed)
NAME:  [name], [name]                      ACCOUNT NO.:  [id number]   MEDICAL RECORD NO.:  [id number]                   PATIENT TYPE:  INP   LOCATION:  4727                                 FACILITY:  [facility]   PHYSICIAN:  Arturo Morton. Riley Kill, M.D. [facility]         DATE OF BIRTH:  Oct 11, 1925   DATE OF ADMISSION:  02/12/2002  DATE OF DISCHARGE:                                HISTORY & PHYSICAL   CHIEF COMPLAINT:  Chest pain.   HISTORY OF PRESENT ILLNESS:  The patient is a 77 year old transfer from  [facility] this evening for further evaluation of  chest pain.  She is currently on a drip of intravenous heparin and  nitroglycerin.  She states she has had a history of a leaky heart valve in  the past and has a history of increased heart rate and has been treated with  Inderal on a twice-daily basis.  She has been followed by Dr. Nobie Putnam and  Dr. Daleen Squibb up in [place].  There is no prior history of MI.  She last saw  Dr. Daleen Squibb about three to four months ago and has a history of anxiety.   This morning at about 10 a.m., she developed chest pain while doing normal  things around the house.  The pain was substernal, at some point sharp and  radiated to the left arm.  She became short of breath with some nausea and  diaphoresis.  She call the emergency medical services and was treated with  nitroglycerin with some relief and is now pain free.  The patient  purportedly had a negative treadmill test in December of 2002.   PAST MEDICAL HISTORY:  1. Degenerative joint disease.  2. Non-insulin-dependent diabetes mellitus.  3. Anxiety.  4. Diverticulosis and irritable bowel.  5. Prior back surgery on multiple occasions, most recently at [facility].  6. Shoulder surgery.  7. Cholecystectomy.  8. Tonsillectomy and adenoidectomy.   ALLERGIES:  MORPHINE and OXYCONTIN.   MEDICATIONS:  1. Lipitor 10 mg daily.  2. Inderal 40 mg p.o. b.i.d.  3. Insulin 70/30, dose uncertain.  4.  Librax 1 at night.  5. Reglan b.i.d., a.c. and h.s.  6. Protonix 40 mg.  7. Klonopin.  8. Remeron.   SOCIAL HISTORY:  The patient lives alone in [place].  She is widowed. She  does have two children. She does not smoke or drink and is retired.   FAMILY HISTORY:  The patient's mother died suddenly with a rapid heart beat  at age 67.  Father died in the 50s and had heart trouble and cancer.  He has  one brother with prostate, hypertension.  He has four sisters, one of whom  has diabetes mellitus.   REVIEW OF SYSTEMS:  She denies fever, chills, and sweats.  She has not had a  rash.  There are no obvious GU or neuropsychiatric symptoms.  She does  complain of arthralgias, and her  back pain has been an ongoing problem.  She  also has cold intolerance.  The history is otherwise negative.   PHYSICAL EXAMINATION:  GENERAL:  She is an alert, oriented but quite anxious  77 year old white female in no acute distress.  VITAL SIGNS:  Blood pressure 109/53, pulse in the 60s, temperature 98.2,  respirations 18.  NECK:  No obvious carotid bruits.  CHEST:  Lung fields are clear to auscultation and percussion.  CARDIAC:  Heart sounds were really quite distant and difficult to hear in  the room.  ABDOMEN:  Soft and minimally tender with no obvious focal abnormality.  GU/RECTAL:  Deferred.  EXTREMITIES:  Pulses were intact, and there was trace edema.  SKIN:  Warm and dry without any obvious rash.  NEUROLOGIC:  Grossly intact.   LABORATORY DATA:  Electrocardiogram reveals normal sinus rhythm.  There are  no acute abnormalities.  Voltage is somewhat diminished.   Hemoglobin 13.9, hematocrit 40.1.  Total protein 6.3, albumin 3.8. bilirubin  1.3, glucose 121, BUN 8, creatinine 0.8.  INR was 1.1.  CK total was 34, CK-  MB 0.9, troponin 0.01.   Chest x-ray shows mild atelectasis at bases, no CHF.   IMPRESSION:  1. Chest pain of uncertain etiology.  2. Non-insulin-dependent diabetes mellitus.   3. Hyperlipidemia.  4. Chronic anxiety.  5. Other problems as noted above.   I have had a thorough discussion with the patient, and she would prefer to  start first with a Cardiolite study.  She thinks that her nerves are getting  the better of her.  There are no acute EKG changes, and cardiac biomarkers  are  negative at the present time.  She has had a negative study earlier last  year, and she is diabetic, so her risk of having underlying coronary disease  is moderately high.  However, she is quite anxious, and this may the basis.  With the reduced voltage, we will also get a 2-D echocardiogram.                                               Arturo Morton. Riley Kill, M.D. [facility]    TDS/MEDQ  D:  02/12/2002  T:  02/13/2002  Job:  [id number]   cc:   Thomas C. Daleen Squibb, M.D. [facility]   Patrica Duel, MD  [address], [address]  [place]  [place] [address]  Fax: [phone]

## 2010-09-07 NOTE — Discharge Summary (Signed)
NAME:  [name], [name]                      ACCOUNT NO.:  [id number]   MEDICAL RECORD NO.:  [id number]                   PATIENT TYPE:  INP   LOCATION:  4727                                 FACILITY:  [facility]   PHYSICIAN:  Cecil Cranker, M.D. [facility]         DATE OF BIRTH:  May 24, 1925   DATE OF ADMISSION:  02/12/2002  DATE OF DISCHARGE:  02/16/2002                           DISCHARGE SUMMARY - REFERRING   PROCEDURES:  1. Cardiac catheterization.  2. Coronary arteriogram.  3. Left ventriculogram.   HOSPITAL COURSE:  The patient is a 77 year old female with no known history  of coronary artery disease, who had a negative Cardiolite in December of  2002.  She was transported from [facility] Room to [facility] for evaluation of chest pain.  She has a history of anxiety and an  increased heart rate treated with Inderal.  She stated that she was doing  normal housework when she developed substernal chest pain that radiated to  her left arm and was associated with shortness of breath as well as nausea  and diaphoresis.  She received nitroglycerin with some relief and was  transported to [facility] for further evaluation and treatment.   The patient was pain-free on heparin and continued on her other medications.  She was scheduled for a cardiac catheterization which was performed on  February 15, 2002.   The cardiac catheterization showed a normal left main and an LAD with a 60-  70% stenosis proximally as well as a 60% distal stenosis.  There was another  40% stenosis as well.  The circumflex had a 50% mid-stenosis and the RCA had  a proximal/ostial 40% stenosis.   The patient tolerated the procedure well and the sheath was removed without  difficulty.  The films were reviewed by Dr. Arturo Morton. Stuckey as well as Dr.  Charlies Constable  They were not convinced that coronary artery disease was the  source of her symptoms.  The family felt that her nerves  accounted for her  symptoms and in discussion with the patient, she preferred medical therapy  over percutaneous intervention.  Imdur was added to her medication regimen  and she was scheduled for close outpatient followup.  She was considered  stable for discharge on February 16, 2002.   An additional problem was a possible UTI.  A urinalysis was done which was  hazy except for moderate leukocyte esterase, with 7 to 10 wbc's, 0 to 2  rbc's and rare bacteria per high power field.  The epithelial cells were  rare as well.  The initial culture was negative but was reincubated for  better growth.  She was initially started on Cipro, pending culture results,  but with the culture negative at this time, she is not being discharged on  Cipro and medication will be called on for her if the reincubation of the  culture shows infection.   DISCHARGE CONDITION:  Stable.   DISCHARGE DIAGNOSES:  1. Chest pain, medical therapy for coronary artery disease.  2. Preserved left ventricular function.  3. History of degenerative joint disease.  4. Insulin-dependent diabetes mellitus.  5. History of anxiety.  6. History of diverticulosis with irritable bowel syndrome.  7. Status post back surgery x2, shoulder surgery, cholecystectomy,     tonsillectomy and adenoidectomy.  8. Family history of diabetes but no history of premature coronary artery     disease.  9. Hypokalemia.   LABORATORY VALUES:  Hemoglobin 12.1, hematocrit 35.3, WBC 6.4, platelets  148,000.  Sodium 145, potassium 3.4, chloride 109, CO2 26, BUN 3, creatinine  0.7, glucose 147.   Chest x-ray:  Spinal fixation of hardware is noted in the mid-to-upper  lumbar region.  There is no CHF.  Mild atelectasis is seen at the bases.   DISCHARGE INSTRUCTIONS:  1. Her activity level is to include no driving, sexual or strenuous activity     for two days.  2. She is to stick to a low-fat diabetic diet.  3. She is to call the office for problems  with the catheterization site.  4. She is to see the P.A. for Dr. Maisie Fus C. Wall on Thursday, November 15th,     at 11 a.m.  5. She is to follow up with Dr. Patrica Duel.   DISCHARGE MEDICATIONS:  1. Lipitor 10 mg every day.  2. Inderal 40 mg b.i.d.  3. Insulin as prior to admission.  4. Librax as prior to admission.  5. Reglan 5 mg b.i.d. or t.i.d. per Dr. Nobie Putnam.  6. Protonix 40 mg every day.  7. Remeron 15 mg every day.  8. Klonopin 0.5 mg b.i.d.  9. Coated aspirin 81 mg every day.  10.      Nitroglycerin 0.4 mg p.r.n.  11.      Imdur 60 mg one-half tab every day.     Lavella Hammock, P.A. [facility]                  E. Graceann Congress, M.D. [facility]    RG/MEDQ  D:  02/16/2002  T:  02/16/2002  Job:  [id number]   cc:   Patrica Duel, MD  [address], [address]  [place]  [place] [address]  Fax: [phone]   Jesse Sans. Wall, M.D. [facility]

## 2010-09-07 NOTE — Procedures (Signed)
   [name], [name]                        ACCOUNT NO.:  [id number]   MEDICAL RECORD NO.:  [id number]                   PATIENT TYPE:   LOCATION:                                       FACILITY:   PHYSICIAN:  Edward L. Juanetta Gosling, M.D.             DATE OF BIRTH:   DATE OF PROCEDURE:  02/12/2002  DATE OF DISCHARGE:                                EKG INTERPRETATION   TIME AND DATE:  1241 on 02/12/02   INTERPRETATION:  The rhythm is sinus rhythm with a rate in the 70.  There is  generally low voltage.   IMPRESSION:  Otherwise normal electrocardiogram.                                               Edward L. Juanetta Gosling, M.D.    ELH/MEDQ  D:  02/15/2002  T:  02/16/2002  Job:  [id number]

## 2010-09-07 NOTE — Consult Note (Signed)
[name], [name]            ACCOUNT NO.:  [id number]   MEDICAL RECORD NO.:  [id number]          PATIENT TYPE:  AMB   LOCATION:                                FACILITY:  [facility]   PHYSICIAN:  Lionel December, M.D.    DATE OF BIRTH:  August 02, 1925   DATE OF CONSULTATION:  05/21/2006  DATE OF DISCHARGE:                                 CONSULTATION   PRESENTING COMPLAINT:  Chest pain, dysphagia, infrequent heartburn.   HISTORY OF PRESENT ILLNESS:  [name] is a 77 year old Caucasian female  patient of Dr. Nobie Putnam who was recently evaluated by Dr. Juanito Doom  because of chest pain.  Dr. Daleen Squibb felt that her pain was noncardiac.  Since she had noninvasive evaluation in October 2006 with Myoview and  echocardiography, he felt further workup was not warranted and felt she  needed GI evaluation.  He did increase her Protonix to 40 mg b.i.d.  The  patient states that Protonix has not helped.  She has heartburn every  day.  It is worse after meals.  She also has the regurgitation if she  stoops forward in the evening.  She also complains of chest tightness  and pain not associated with dyspnea or diaphoresis.  She is having  episodes of dysphagia with solids a couple of times a week.  She also  reports difficulty swallowing her pills particularly potassium.  She  points to her midsternal area as the site of bolus obstruction.  Either  the bolus eventually passes down on or she regurgitates it with relief.  She also complains of irregular bowel movements.  She has constipation  for 2 or 3 days and then she has explosive bowel movements.  She states  she is eating lot of fiber which does not help.  She does not experience  abdominal pain as long as she takes her Librax.  She is also having  inner ear problems and was begun on meclizine yesterday.  She complains  of chronic back pain.  Only time she gets relieved if she is lying on  her back and is still.  She was seen by neurosurgeon at [facility] last  month  and he felt that the risk of surgery was rather high and recommended  conservative management.   [name] underwent EGD back in February 2003 when she presented with  nausea, vomiting and dysphagia.  She had distal esophageal ring which  was disrupted by passing 56-French Maloney dilator but she did not have  esophagitis or peptic ulcer disease.  She has been on Protonix for about  7 years.   She is on Librax one b.i.d., Inderal 40 mg b.i.d., Xanax 2 mg q.h.s.,  KCl 28 mEq daily, Protonix 40 mg b.i.d., Lasix 20 mg daily p.r.n.,  Lipitor 10 mg daily, insulin 70/30 18 units in a.m. and 16-18 units in  p.m.  She is able to titrate the dose depending on her blood glucose  levels, ASA 81 mg daily, Colace 3 tablets at bedtime, meclizine 25 mg  b.i.d. p.r.n., Pepcid AC p.r.n.   PAST MEDICAL HISTORY:  1. Several year history of  IBS.  She has undergone multiple studies in      the past.  She had flexible sigmoidoscopy in June 2000 which was      normal other than sigmoid colon diverticulosis.  Prior to that, she      had multiple colonoscopies (1982, 1985, 1988 and 1991) revealing      sigmoid colon diverticulosis.  She also had a barium enema in 1994      which was unremarkable except diverticulosis.  2. She has hypertension.  3. Diabetes mellitus.  4. Chronic back pain.  5. She has had bilateral cataract surgery.  6. She also had laparotomy years ago at [facility] with removal of a mass      which was adherent to the bladder and colon.  She tells me she was      under care of Dr. Earlene Plater at that time.   OTHER SURGERIES:  1. Include appendectomy.  2. Tonsillectomy.  3. Hysterectomy.  4. Hemorrhoidectomy.  5. Cholecystectomy in March 1994.  6. She had right shoulder surgery in 1998.  7. Lumbar laminectomy in June 2000.  8. She also has had nodules removed from her vocal cords.   ALLERGIES:  1. PERCODAN.  2. PERCOCET.  3. STEROIDS.   SOCIAL HISTORY:  She is a widow.  She is  retired.  She lives alone.  She  has a son who lives a couple of miles from her and one of her daughters  is out of town.   OBJECTIVE:  Weight 163 pounds.  She is 5 feet 1 inch tall.  Pulse 64 per  minute, blood pressure 120/70, temperature is 98.6.  Conjunctivae is  pink.  Sclerae is nonicteric.  Oropharyngeal mucosa is normal.  No neck  masses are noted.  Cardiac exam was regular rhythm with a normal S1-S3.  No murmur or gallop noted.  Lungs are clear to auscultation.  Abdomen is  symmetrical.  Bowel sounds are normal, soft and nontender without  organomegaly or masses.  Rectal examination reveals soft stool in the  vault which is guaiac negative.  No peripheral edema or clubbing noted.   ASSESSMENT:  1. Chest pain, heartburn and dysphagia:  All of these symptoms point      to esophageal etiology.  She is on double dose Protonix but it is      not helping.  It is time for [place] to switch her to another proton      pump inhibitor.  Given her history, she could also have esophageal      motility disorder.  2. Irritable bowel syndrome with irregular bowel movements despite      being on a high fiber diet.  She is also on Librax.   RECOMMENDATIONS:  1. Discontinue Protonix.  2. Start Prevacid 30 mg b.i.d. 2-week supply given.  3. Continue high-fiber diet and Colace as before.  Use Dulcolax      suppository every other day or daily on as-      needed basis.  4. Barium pill esophagogram.   Further recommendations or need for EGD will be deferred until barium  study has been completed.      Lionel December, M.D.  Electronically Signed     NR/MEDQ  D:  05/21/2006  T:  05/21/2006  Job:  [id number]   cc:   Patrica Duel, M.D.  Fax: [phone]   Jesse Sans. Wall, MD, FACC  [address]  [address]  [place]  [place] [address]

## 2010-09-07 NOTE — H&P (Signed)
NAME:  [name], [name]                      ACCOUNT NO.:  [id number]   MEDICAL RECORD NO.:  [id number]                   PATIENT TYPE:  INP   LOCATION:  A328                                 FACILITY:  [facility]   PHYSICIAN:  Patrica Duel, M.D.                 DATE OF BIRTH:  16-Jan-1926   DATE OF ADMISSION:  07/03/2003  DATE OF DISCHARGE:                                HISTORY & PHYSICAL   CHIEF COMPLAINT:  Severe back pain and leg weakness.   HISTORY OF PRESENT ILLNESS:  This is a 77 year old female with a history of  insulin-dependent diabetes mellitus.  She has chronic severe degenerative  disc disease and is status post multiple procedures on her back.  She has  recently-documented spinal stenosis.  She also has longstanding  hypertension, hemorrhoids, and severe anxiety/depression.   The patient has presented to the emergency department on four different  occasions within the past week with progressive weakness and inability to  ambulate.  Her son has tried diligently to take care of her.  She is  essentially unable to care for herself at home and presented back to the  emergency department for admission, physical therapy, and placement.   There is no history of headache, neurologic deficits, chest pain, shortness  of breath, nausea, vomiting, diarrhea, melena, hematemesis, hematochezia, or  genitourinary symptoms.   CURRENT MEDICATIONS:  1. Librax one t.i.d.  2. Propranolol one b.i.d.  3. Lasix 20 b.i.d.  4. Clarinex 5 daily.  5. Reglan 5 mg q.i.d.  6. Xanax 0.5 daily.   ALLERGIES:  STEROIDS.   PAST MEDICAL HISTORY:  As noted above.   FAMILY HISTORY:  Noncontributory.   REVIEW OF SYSTEMS:  Negative except as mentioned.   PHYSICAL EXAMINATION:  GENERAL:  Somewhat depressed female who is alert,  fully oriented.  VITAL SIGNS AT PRESENTATION:  Temperature 97.8, blood pressure 144/69, pulse  69, respirations 20.  HEENT:  Normocephalic, atraumatic.  The pupils are  equal.  Ears, nose,  throat are benign.  NECK:  Supple, no bruits noted.  LUNGS:  Clear.  HEART:  Heart sounds are normal except for occasional ectopic.  ABDOMEN:  Nontender, nondistended.  Bowel sounds are intact.  EXTREMITIES:  No clubbing, cyanosis, or edema.  Straight leg raise is  positive bilaterally.  Generalized weakness of the lower extremities is  apparent.  No sensory deficit.   PERTINENT LABORATORY DATA:  Unremarkable except for a urinalysis revealing  21-50 wbc's and negative nitrite.  Sodium 131.  Platelets 231.   ASSESSMENT:  General debility with severe spinal stenosis and inability to  ambulate in a 77 year old female with history as noted above.   PLAN:  Admit for physical therapy, pain control, and placement planning.  Of  note, Duragesic patches were attempted though she reacted locally to the  patch.  Will follow and treat expectantly.     ___________________________________________  Patrica Duel, M.D.   MC/MEDQ  D:  07/04/2003  T:  07/04/2003  Job:  [id number]

## 2010-09-07 NOTE — Procedures (Signed)
[facility]  Patient:    [name], [name]                   MRN: [id number] Proc. Date: 10/03/99 Adm. Date:  [id number] Attending:  Thyra Breed CC:         Marlan Palau, M.D.             Dr. Vivia Ewing, [facility], [place], [place].                           Procedure Report  PROCEDURE:  Left sacroiliac joint injection.  DIAGNOSIS:  SI joint pain with degenerative changes in the joint and L5 radiculopathy on the left.  INTERVAL HISTORY:  The patient returns for a repeat injection. She has been hospitalized for a pneumonia since her last visit and diagnosed with asthma. She has also had her blood sugars adjusted significantly. She presents for a repeat injection of her left SI joint. About 2 weeks ago, it began to reoccur as a painful nidus and has gotten worse over the past week. She has been taking Tylenol for the pain.  CURRENT MEDICATIONS:  Toprol 50 mg a day. Lozol 2.5 mg per day, Glucotrol XL 5 mg 2 in the morning and 1 at night, Avandia 4 mg 1 twice a day, Lasix 1/2 tablet twice a day of 40 mg, Xanax 0.5 mg 1 b.i.d., Protonix 40 mg a day, Librax 5-2.5 2 per day in the morning and evening, K-Dur, Glucophage XR, nystatin 4 times a day, Citrucel and albuterol with ipratropium inhalers.  PHYSICAL EXAMINATION:  VITAL SIGNS:  Blood pressure is 145/69, heart rate is 81, respirations 20, O2 saturations 98%, pain level is 5/10 and temperature is 98.1.  EXTREMITIES:  The patient exhibited tenderness over the left SI joint.  DESCRIPTION OF PROCEDURE:  After informed consent was obtained, the patient was placed in the prone position and monitored. Using fluoroscopic guidance, I identified the left SI joint. I marked the skin overlying this and prepped it with a Betadine x 3. A skin wheal was raised at the junction of the distal middle thirds. A 25 gauge needle was introduced down to the SI joint on the left side confirmed by lateral and  AP projections. Aspiration was negative. I injected a 0.5 cc of 1% lidocaine. The patient tolerated this well. I went ahead and injected 40 mg of Medrol with 1 cc of 1 percent lidocaine. A 0.5 cc of 1% lidocaine was used to flush the needle and the needle was removed intact.  CONDITION POST PROCEDURE:  Stable.  DISCHARGE INSTRUCTIONS: 1. Resume previous diet. 2. Limitations in activities per instruction sheet. 3. Continue on current medications. 4. Followup with me in September for repeat SI joint injection. DD:  10/03/99 TD:  10/05/99 Job: [id number]

## 2010-09-07 NOTE — Procedures (Signed)
   NAME:  [name], [name]                      ACCOUNT NO.:  [id number]   MEDICAL RECORD NO.:  [id number]                   PATIENT TYPE:  INP   LOCATION:  A217                                 FACILITY:  [facility]   PHYSICIAN:  Madaline Savage, M.D.             DATE OF BIRTH:  Dec 09, 1925   DATE OF PROCEDURE:  11/26/2002  DATE OF DISCHARGE:                                  ECHOCARDIOGRAM   PROCEDURE:  Echocardiogram, 2-dimensional.   INDICATIONS FOR PROCEDURE:  Left ventricular hypertrophy, hypertension,  diabetes mellitus.   SUMMARY:  Technical aspects of the study are adequate for interpretation  results.   1. Aortic valve. The aortic valve is tri-leaflet. It is structurally     unremarkable in appearance. It opens and closes normally. No stenosis     noted. There is mild aortic regurgitation directed toward the anterior     mitral leaflet.   1. Mitral valve. Structurally normal. No abnormalities detected.   1. Pulmonic valve. Incompletely seen. Overall normal trivial regurgitation     noted.   1. Tricuspid valve. Normal appearance. No regurgitation seen.   1. Aorta. Normal aortic root dimension of 3.4.   1. Left atrium. Normal left atrial size of 3.6. No atrial thrombus     identified. Right atrium normal. Right ventricle normal. Left ventricle     normal LV sizes. End-diastolic dimension 3.9. End-systolic dimension 2.6.     Concentric left ventricular hypertrophy, mild degree. Septum and     posterior walls both measure 1.2. Overall contractility normal. Ejection     fraction estimate 60%. Pericardium, no effusion seen.    FINAL DIAGNOSES:  1. Normal left ventricular systolic function.  2. Unremarkable valvular architecture with mild aortic regurgitation.  3. Concentric left ventricular hypertrophy, mild degree.                                               Madaline Savage, M.D.    WHG/MEDQ  D:  11/26/2002  T:  11/27/2002  Job:  [id number]   cc:   Corrie Mckusick,  M.D.  [address] [place]. [place]  [place] [address]  Fax: [phone]   Madaline Savage, M.D.  [phone] [address], [address]  [place]  [place] [address]  Fax: [phone]

## 2010-09-07 NOTE — Procedures (Signed)
NAME:  [name], [name]                      ACCOUNT NO.:  [id number]   MEDICAL RECORD NO.:  [id number]                   PATIENT TYPE:  INP   LOCATION:  A314                                 FACILITY:  [facility]   PHYSICIAN:  Edward L. Juanetta Gosling, M.D.             DATE OF BIRTH:  15-Jun-1925   DATE OF PROCEDURE:  08/15/2003  DATE OF DISCHARGE:                                EKG INTERPRETATION   Time:  1145 hours  Date:  August 15, 2003   The computer has read this as ventricular tachycardia and this is secondary  to artifact, the rhythm looks like it is probably a sinus rhythm but I  cannot make much more out of this EKG because of the degree of artifact.  Defective EKG.      ___________________________________________                                            Oneal Deputy Juanetta Gosling, M.D.   ELH/MEDQ  D:  08/15/2003  T:  08/16/2003  Job:  [id number]

## 2010-09-07 NOTE — H&P (Signed)
NAME:  [name], [name]                      ACCOUNT NO.:  [id number]   MEDICAL RECORD NO.:  [id number]                   PATIENT TYPE:  INP   LOCATION:  A215                                 FACILITY:  [facility]   PHYSICIAN:  Kirk Ruths, M.D.            DATE OF BIRTH:  November 19, 1925   DATE OF ADMISSION:  07/15/2003  DATE OF DISCHARGE:                                HISTORY & PHYSICAL   CHIEF COMPLAINT:  Chest pain.   HISTORY OF PRESENT ILLNESS:  This is a 77 year old insulin-dependent  diabetic, resident of a nursing home.  The patient this morning had two  episodes of what she describes as excruciating substernal chest pain with  numbness in both arms.  The pain was diminished by nitroglycerin x2.  In the  emergency room, EKG shows no acute changes.  Initially cardiac enzymes are  negative.  With her multiple risk factors, she is admitted for rule-out  myocardial infarction.   PAST MEDICAL HISTORY:  The patient has a history of severe degenerative disk  disease with severe spinal stenosis for which she requires multiple pain  medications.  The patient has longstanding hypertension, hyperlipidemia,  anxiety and depression.   REVIEW OF SYSTEMS:  She denies nausea, vomiting, diaphoresis, but does say  she has been short of breath.  She denies leg pain.   MEDICATIONS:  1. K-Dur 20 mEq daily.  2. Librax one t.i.d.  3. Protonix 40 mg daily.  4. Zocor 20 mg daily.  5. Inderal 40 mg b.i.d.  6. Xanax 2 mg q.6h. p.r.n.  7. Ultram 50 mg q.6h. p.r.n.  8. Ambien 10 mg q.h.s.  9. Lasix 20 mg b.i.d.  10.      Reglan 5 mg q.i.d.  11.      Remeron 30 mg q.h.s.  12.      She is on 70/30 insulin, 14 in the morning and 10 in the evening.   PHYSICAL EXAMINATION:  GENERAL:  An elderly white female, in no acute  distress.  VITAL SIGNS:  Blood pressure 145/80.  She is afebrile.  Pulse is 78 and  regular.  Respiratory rate 20.  HEENT:  TM's are normal.  Pupils are equal to light and  accommodation.  Oropharynx is benign.  NECK:  Supple, without JVD, bruits or thyromegaly.  LUNGS:  Clear in all area.  HEART:  Regular sinus rhythm, without murmur, gallop or rub.  CHEST:  There is some mild chest-wall tenderness.  ABDOMEN:  Soft, nontender.  EXTREMITIES:  Without clubbing, cyanosis or edema.  NEUROLOGIC:  Exam is grossly intact.   ASSESSMENT:  1. Chest pain, possible unstable angina.  2. Insulin-dependent diabetes.  3. Severe back pain, spinal stenosis and degenerative joint disease.  4. Chronic anxiety and depression.     ___________________________________________  Kirk Ruths, M.D.   WMM/MEDQ  D:  07/15/2003  T:  07/15/2003  Job:  [id number]

## 2010-09-07 NOTE — Procedures (Signed)
[facility]  Patient:    [name], [name]                   MRN: [id number] Proc. Date: 12/13/99 Adm. Date:  [id number] Attending:  Thyra Breed CC:         Dr. Jodelle Green, [facility]  Marlan Palau, M.D.   Procedure Report  PROCEDURE:  Facet nerve blocks at L3-4, 4-5 and 5-S1 on the left side.  DIAGNOSIS:  Lumbar spondylosis with facet joint arthropathy.  INTERVAL HISTORY:  The patient noted good pain relief with her last series of blocks but she is beginning to note some discomfort coming back. She is interested in going over and seeing Dr. Cherene Altes but wants to go ahead and proceed with the facet joint blocks today. I advised her that I wanted to proceed with these without going ahead and doing the SI joint injection. If she does respond then she may be a candidate for radiofrequency lesioning which we are no doing here.  CURRENT MEDICATIONS:  Insulin, Xanax, Inderal, Librax, Lozol, Protonix, K-Dur, Lasix, albuterol, Atrovent, and she is using extra strength Tylenol.  PHYSICAL EXAMINATION:  She has symmetric deep tendon reflexes at the knee and ankles. She has mute plantar reflexes. She has tenderness over the left facet joints.  DESCRIPTION OF PROCEDURE:  After informed consent was obtained, the patient was taken to the fluoroscopy suite where she was placed in the prone position with a pillow under her abdomen. Using fluoroscopic guidance, I identified the lumbar vertebra and marked the junction of the transverse process to the superior articulating process at L3-4, 4-5 and 5-S1. The skin was prepped with Betadine x 3 and draped. I anesthetized the skin with 1% lidocaine using a 25 gauge needle. Twenty-five gauge spinal needles were introduced down to the junction of the superior articulating process with the transverse process at each of these levels. Aspiration was negative. The L5-S1 needle was placed at the sacral cornu. Aspiration was  negative and 1% lidocaine 0.5 cc was injected at each level. Thirty seconds later, there was no sign of spinal nor nerve root block. A 13 mg of Medrol with 0.5 cc of 1% lidocaine was injected at each level. The needle was flushed with 1% lidocaine and removed intact at each level.  CONDITION POST PROCEDURE:  The patient had resolution in her pain.  DISPOSITION:  1. Resume previous diet.  2. Limitations in activities per instruction sheet.  3. Continue on current medications.  4. I advised the patient we would go ahead and facilitate an evaluation     by Dr. Theodora Blow at [facility]. In the interim, I     advised her to consider having radiofrequency lesioning of the facet     joint nerves at these levels and she does have a positive response. She     has very significant degenerative changes in the lumbar spine and I     advised her that he may have options in addition to the RF.  I plan to see the patient back in follow-up in about 4 weeks. DD:  12/13/99 TD:  12/15/99 Job: [id number]

## 2010-09-07 NOTE — Discharge Summary (Signed)
NAME:  [name], [name]                      ACCOUNT NO.:  [id number]   MEDICAL RECORD NO.:  [id number]                   PATIENT TYPE:  INP   LOCATION:  A215                                 FACILITY:  [facility]   PHYSICIAN:  Patrica Duel, M.D.                 DATE OF BIRTH:  11/10/1925   DATE OF ADMISSION:  07/15/2003  DATE OF DISCHARGE:  07/19/2003                                 DISCHARGE SUMMARY   DISCHARGE DIAGNOSES:  1. Chest pain of questionable etiology, no recurrence, further cardiac     workup declined.  2. Insulin-dependent diabetes mellitus.  3. General debility, requiring nursing home placement.  4. Gastroesophageal reflux.  5. Hypertension, controlled.  6. Severe anxiety and depression.  7. Spinal stenosis, status post intervention 4-5 years ago.   HISTORY:  For details regarding admission, please refer to the admit note.  Briefly, this 77 year old female, with the above history, presented to the  emergency department with what she described as excruciating substernal  chest pain with numbness in both arms.  The pain was relieved with  nitroglycerin x2.  In the emergency room, EKG showed no acute changes,  enzymes were negative.  She was admitted for rule-out MI protocol and  further evaluation and therapy as indicated.   COURSE IN THE HOSPITAL:  The patient did well in the hospital without any  recurrent chest pain.  Rule out MI protocol was negative.  EKG remained non-  acute.  Cardiology was consulted.  A Cardiolite was discussed, however, the  patient flatly refuses further evaluation at this time.  She is adamant  about this, despite our urgings.   Currently, the patient is stable, she is eating well and has had no  recurrent chest pain and will be discharged back to [facility] for further  rehab and therapy.  We will pursue cardiac workup, should the patient become  willing or symptoms recur.   DISPOSITION:   MEDICATIONS:  Medications include:  1. K-Dur 20  mEq daily.  2. Lasix 20 mg daily.  3. Librax 1 t.i.d.  4. Protonix 40 mg daily.  5. Zocor 20 mg daily.  6. Lopressor 40 mg b.i.d.  7. Xanax 2 mg q.6 h. p.r.n.  8. Ultram 50 mg q.6 h. p.r.n. pain.  9. Ambien 10 mg nightly.  10.      Lasix 20 mg p.o. b.i.d.  11.      Reglan 5 mg 4 times daily.  12.      Remeron 30 mg nightly.  13.      Insulin 70/30 -- 14 units q.a.m. and 10 units q.p.m.Marland Kitchen   FOLLOWUP:  She will be followed and treated expectantly as an outpatient.     ___________________________________________  Patrica Duel, M.D.   MC/MEDQ  D:  07/19/2003  T:  07/19/2003  Job:  [id number]

## 2010-09-07 NOTE — Procedures (Signed)
[facility]  Patient:    [name], [name]                   MRN: [id number] Proc. Date: 11/22/99 Adm. Date:  [id number] Attending:  Thyra Breed CC:         Dr. Jodelle Green, [facility], [place], [place]  C. Lesia Sago, M.D.   Procedure Report  PROCEDURE:  Facet joint nerve blocks at L3-4, 4-5 and L5-S1 on the left side as well as left SI joint injection.  DIAGNOSIS:  Lumbar spondylosis with SI joint pain.  INTERVAL HISTORY:  The patient has noted that her pain has reoccurred. She is getting to a point where she can barely do her household chores. She has had Dr. Newell Coral review her records and he does not feel that there are any surgical options he has to offer to her.  She has developed some clumsiness and a tremor but has not seen Dr. Anne Hahn back with regard to this. She is attributing a lot of it to her diabetes which has been up and down. She has recently had her sugars up as high as 300 and then down in the 50s this morning but is now pretty stable at the present time. She rates her pain at 9/10. She is somewhat at her wits end with the whole situation.  CURRENT MEDICATIONS:  Insulin, Xanax, Inderal, Librax, Lozol, protonix, K-Dur, Lasix, albuterol and Atrovent inhalers.  PHYSICAL EXAMINATION:  Her SI joint on the left side is tender as well as the left sided facet joints. She has a well healed surgical scar.  NEUROLOGIC:  She has symmetric deep tendon reflexes at the knee and ankles which are unchanged from previously. She has mute plantar reflexes.  DESCRIPTION OF PROCEDURE:  After informed consent was obtained, the patient was taken to the fluoroscopy suite where she was placed in the prone position with a pillow under her abdomen and monitored. Using fluoroscopic guidance, I identified the lumbar vertebrae and marked the junction of the superior articular process and the transverse process at L3-4, 4-5 and 5-S1 as well as the left  SI joint. The skin was prepped with Betadine x 3 and draped. I placed local anesthetic in the subcutaneous tissues at the various sites with a 25 gauge needle using approximately 1:2 cc of 1% lidocaine at each site. 25 gauge spinal needles were introduced down to the junction of the superior articulating process and transverse process at 3-4, 4-5 and 5-S1 which the lateral being the ______ cornu. Placement was confirmed by lateral oblique and AP projections. Aspiration was negative. I injected a 0.5 cc of 1% lidocaine at each level and waited 30 seconds. There was no sign of a spinal or nerve root block. Local anesthetic with Medrol was injected at each site with a volume of 0.75 cc. The needle was flushed with 1% lidocaine and removed. The SI joint area was injected with a 25 gauge needle which was placed under fluoroscopic guidance and confirmed by AP, lateral and oblique projections. Aspiration was negative for blood. I injected 0.75 cc of a combination of Medrol with 1% lidocaine. The total amount of Medrol given was 40 mg because of her diabetes. The needles were flushed with 1% lidocaine and removed intact.  CONDITION POST PROCEDURE:  Stable.  DISCHARGE INSTRUCTIONS: 1. Resume previous diet. 2. Limitations in activities per instruction sheet. 3. Continue on current medications. 4. Followup with me in 3-4 weeks. If she is not improving, she has  requested    that she be evaluated down at [facility] and I recommended that if she wants a    third opinion that Dr. Elvina Sidle or Dr. Senaida Ores at the spine clinic might be    able to offer her that opinion. I have encouraged her to follow-up with Dr.    Anne Hahn with regard to her tremor which seems to be more prominent. This may    be related to her underlying diabetes and the fluctuations in her blood    sugars. On exam, she does have a mild tremor but is able to finger to nose    although somewhat labored and she is able to do a heel to shin  although    this is limited due to her low back pain. She has a very deliberate    antalgic gait. DD:  11/22/99 TD:  11/24/99 Job: [id number]

## 2010-09-07 NOTE — Discharge Summary (Signed)
NAME:  [name], [name]                      ACCOUNT NO.:  [id number]   MEDICAL RECORD NO.:  [id number]                   PATIENT TYPE:  INP   LOCATION:  A217                                 FACILITY:  [facility]   PHYSICIAN:  Corrie Mckusick, M.D.               DATE OF BIRTH:  04-Jul-1925   DATE OF ADMISSION:  11/25/2002  DATE OF DISCHARGE:  11/30/2002                                 DISCHARGE SUMMARY   DISCHARGE DIAGNOSIS:  Weakness, question cerebrovascular accident but  doubtful.   For history of present illness and past medical history, please see  admission H&P.   HOSPITAL COURSE:  This is a 77 year old female with a long-standing history  of insulin-dependent diabetes, chronic obstructive pulmonary disease,  osteoarthritis and peripheral neuropathy, who presented to Dr. Nobie Putnam with  weakness.  She was admitted for evaluation of possible CVA.  CT of the head  was done initially on admission which showed no abnormalities.  A normal MRI  was also obtained.  Carotid Dopplers were negative.  Echocardiogram was  obtained and still pending at the time of discharge.  The patient had  physical therapy and speech therapy following her while she was in the  hospital.  Her weakness improved somewhat in the hospital and her  neurological exam essentially was nonfocal during the entire time.  Neurology consult was obtained.  However, the neurologist was not in town  during hospital stay, and this will be done as an outpatient.   It was discussed with the patient that she would need some rehabilitation in  a nursing care facility.  She agreed with this, and this was set up at [facility].   The patient was ready for discharge on November 30, 2002.  One event that did  occur on her left toe is a small cellulitic area that happened to culture  out MRSA.  She was begun on vancomycin 1 g q.12h. which will be continued.  Her diet too will be consulted as an  outpatient.   DISCHARGE PHYSICAL EXAMINATION:  VITAL SIGNS:  T-max 99.0, blood pressure  130/60, heart rate 68, respiratory rate 20, O2 saturation 95% on room air.  Blood glucose 130-170.  GENERAL APPEARANCE:  Pleasant female who appears like her baseline,  fatigued.  CHEST:  Clear to auscultation bilaterally.  CARDIOVASCULAR:  Regular rate with no murmurs.  ABDOMEN:  Soft, nontender.  EXTREMITIES:  No cyanosis, clubbing or edema.  Left great toe cellulitic  areas as described above.   DISCHARGE MEDICATIONS:  1. Aspirin 325 p.o. daily.  2. Inderal 40 mg p.o. b.i.d.  3. Protonix 40 mg p.o. daily.  4. Lasix 20 mg p.o. daily.  5. Sliding scale insulin per the [facility] standing orders.  6. Insulin 70/30 18 units subcu a.m., 14 units subcu p.m.  7. Nystatin powder applied to affected areas q.i.d. p.r.n.  8. Vancomycin 1 g IV q.12h.  9. Lipitor 10 mg p.o. q.p.m.  10.      K-Dur 20 mEq p.o. daily.  11.      Xanax 0.5 mg p.o. t.i.d. p.r.n.  12.      Albuterol nebulizer p.o. q.i.d. p.r.n.  13.      Reglan 10 mg p.o. t.i.d. p.r.n.   ALLERGIES:  PERCOCET and PREDNISONE.   CONDITION ON DISCHARGE:  Improved and stable.   FOLLOWUP:  1. Discharge followup to be done in the nursing care facility.  2. Will continue the IV vancomycin as directed by the podiatrist which will     be consulted in the nursing care facility.  3. Consult Neurology in the nursing care facility as he was unable to see     the patient during the hospital stay.  4. Will also consult PT and speech therapy.                                               Corrie Mckusick, M.D.    Flint Melter  D:  11/30/2002  T:  11/30/2002  Job:  [id number]

## 2010-09-07 NOTE — Discharge Summary (Signed)
NAME:  [name], [name]                      ACCOUNT NO.:  [id number]   MEDICAL RECORD NO.:  [id number]                   PATIENT TYPE:  INP   LOCATION:  A328                                 FACILITY:  [facility]   PHYSICIAN:  Corrie Mckusick, M.D.               DATE OF BIRTH:  1925/12/10   DATE OF ADMISSION:  07/03/2003  DATE OF DISCHARGE:  07/11/2003                                 DISCHARGE SUMMARY   DISCHARGE DIAGNOSES:  Degenerative disk disease with severe debilitating  pain.   HISTORY OF PRESENT ILLNESS/PAST MEDICAL HISTORY:  Please see Admission H&P.   HOSPITAL COURSE:  This is a 77 year old female with long-standing history of  diabetes and chronic severe degenerative disk disease as well as spinal  stenosis who presents for pain control and nursing home placement.  She has  done well from a pain standpoint since admitted, and it was decided between  her and her family that she should be admitted for nursing home placement  for rehabilitation.  Mental health consult was done as well.  They were not  able to visit during this hospital stay.  Pastoral care did take part in the  care.   She continues to have less pain on a daily basis and vitals remain stable.  Blood glucose is in the 150s.  She is ready for placement on March 21.   DISCHARGE PHYSICAL EXAMINATION:  VITAL SIGNS:  Temperature 98.8, blood  pressure 130/70, heart rate 70, respiratory rate 20, blood sugars 112-120.   DISCHARGE MEDICATIONS:  1. Aspirin 81 mg coated daily.  2. Lasix 20 mg p.o. b.i.d.  3. Reglan 5 mg p.o. q.i.d.  4. Insulin 70/30 14 units in the morning and 10 units in the evening.  5. Sliding scale insulin as prescribed.  6. Remeron 30 mg p.o. q.h.s.  7. Protonix 40 mg daily.  8. Clarinex 5 mg p.o. daily.  9. Librax 1 capsule p.o. t.i.d.  10.      Zocor 20 mg p.o. q.p.m.  11.      Inderal 40 mg p.o. b.i.d.  12.      Xanax 2 mg p.o. q.i.d. p.r.n.  13.      Darvocet N-100 p.o. q.6h. p.r.n.  14.       Ultram 50 mg p.o. q.6h. p.r.n.  15.      Ambien 10 mg p.o. p.r.n.   CONDITION ON DISCHARGE:  Improved and stable.   FOLLOWUP:  Follow up as directed with Dr. Nobie Putnam.     ___________________________________________                                         Corrie Mckusick, M.D.   JCG/MEDQ  D:  07/11/2003  T:  07/11/2003  Job:  [id number]

## 2010-09-07 NOTE — H&P (Signed)
NAME:  [name], [name]                      ACCOUNT NO.:  [id number]   MEDICAL RECORD NO.:  [id number]                   PATIENT TYPE:  INP   LOCATION:  A217                                 FACILITY:  [facility]   PHYSICIAN:  Patrica Duel, M.D.                 DATE OF BIRTH:  1925-12-29   DATE OF ADMISSION:  11/25/2002  DATE OF DISCHARGE:                                HISTORY & PHYSICAL   CHIEF COMPLAINT:  Weakness and aphasia.   HISTORY OF PRESENT ILLNESS:  This is a 77 year old female with a  longstanding history of insulin-dependent diabetes mellitus, COPD,  osteoarthritis, and peripheral neuropathy.  She also has been quite  depressed in the past several months which has been addressed.  She has had  several episodes of cellulitis/perionychia of her great toe which has flared  in the distant past and she has cared for herself.   The patient presented to the office today with several-week history of  increasingly severe generalized weakness.  She had an episode of expressive  aphasia and confusion 24 hours ago.  This cleared spontaneously.  She has  fallen in the recent past with no significant injury.   The patient presented to the office for these complaints.  She was found to  have minimal facial droop and a mild expressive aphasia but no other  neurologic deficits.  She seemed somewhat lethargic as well.  A concerned  family member is with the patient and states that she is completely unable  to take care of herself and I am in agreement with this.  The patient is to  be admitted for evaluation of possible CVA, generalized weakness, failure to  thrive.   The patient also has perionychia involving her left great toe.  She has been  on Keflex for the past several days with no response.   There is no history of significant headache, head trauma, nausea, vomiting,  diarrhea, shortness of breath or chest pain, or genitourinary symptoms.   CURRENT MEDICATIONS:  1. Inderal 40  mg b.i.d.  2. Xanax 0.5 t.i.d.  3. Insulin 70/30 eighteen q.a.m. and 14 q.p.m.  4. K-Dur 20 mEq daily.  5. Protonix 40 mg daily.  6. Librax p.r.n. abdominal spasm.  7. Lasix 20 mg b.i.d.  8. Lipitor 10 mg daily.  9. Albuterol sulfate p.r.n.  10.      Home nebulizers.  11.      Tylenol Arthritis.  12.      Reglan 10 t.i.d. p.r.n.  13.      Glucosamine.  14.      Calcium.   ALLERGIES:  1. PERCOCET.  2. PREDNISONE.   PAST MEDICAL HISTORY:  As noted above.  She is status post tonsillectomy,  appendectomy, partial hysterectomy, hemorrhoidectomy, cholecystectomy, and  lumbosacral spine surgery.   SOCIAL HISTORY:  Nonsmoker, nondrinker.  She does live alone.  She is  retired.   REVIEW  OF SYSTEMS:  Negative except as mentioned.   FAMILY HISTORY:  Noncontributory.   PHYSICAL EXAMINATION:  GENERAL:  This is a pleasant elderly female who is  slow with her speech which is changed from her baseline.  VITAL SIGNS:  Blood pressure is 120/80, she is afebrile, pulse is 66 and  regular, respirations 18 and unlabored.  HEENT:  Normocephalic, atraumatic.  Pupils equal.  There is no scleral  icterus.  Ears, nose, throat are benign.  Mucous membranes are moist.  NECK:  Supple.  No audible bruits noted.  LUNGS:  Clear.  CARDIOVASCULAR:  Heart sounds are normal.  No murmurs, rubs, or gallops.  ABDOMEN:  Nontender, nondistended.  Bowel sounds intact.  EXTREMITIES:  No clubbing or cyanosis.  There is trace edema.  There is  perionychia involving the left great toe.  NEUROLOGIC:  Intact reflexes, generalized weakness, and mild expressive  aphasia without overt dysarthria.   LABORATORY DATA:  Laboratory review is currently pending.   ASSESSMENT:  Generalized weakness, failure to thrive, and perionychia and  possible cerebrovascular accident in an elderly female with significant  history as noted above.   PLAN:  Admit for CT of head, carotid Dopplers, physical therapy, speech  therapy, and  occupational therapy as well as neurology consult.  We will  treat empirically with aspirin and continue her home medications except for  Reglan and Lipitor, as these could be contributing to her symptoms.  She  will be followed and treated expectantly.  Hopefully, the patient will agree  to nursing home or domiciliary facility for her ongoing management.  We will  follow and treat expectantly.                                               Patrica Duel, M.D.    MC/MEDQ  D:  11/25/2002  T:  11/25/2002  Job:  [id number]

## 2010-09-07 NOTE — Procedures (Signed)
[name], [name]            ACCOUNT NO.:  [id number]   MEDICAL RECORD NO.:  [id number]          PATIENT TYPE:  REC   LOCATION:                                FACILITY:  [facility]   PHYSICIAN:  Vida Roller, M.D.   DATE OF BIRTH:  02-Dec-1925   DATE OF PROCEDURE:  01/23/2005  DATE OF DISCHARGE:                                    STRESS TEST   HISTORY:  [name] is a 77 year old female with nonobstructive coronary  disease by cath October 2003. The patient now presents with  recurrent  atypical chest discomfort.   BASELINE DATA:  Electrocardiogram reveals sinus rhythm at 64 beats a minute,  nonspecific ST abnormalities, poor R-wave progression. Blood pressure is  140/68.   Thirty eight milligrams of adenosine was infused over 4 minute protocol.  Myoview was injected at 3 minutes. The patient reported chest tightness,  numbness in her hands and shortness of breath with adenosine infusion  resolved in recovery. Electrocardiogram revealed transient second degree AV  block (Wenckebach), no ischemic changes were noted.   Final images and results are pending MD review.      Jae Dire, P.A. [facility]      Vida Roller, M.D.  Electronically Signed    AB/MEDQ  D:  01/23/2005  T:  01/23/2005  Job:  [id number]

## 2010-09-07 NOTE — Procedures (Signed)
NAMEPRABHNOOR, [name]            ACCOUNT NO.:  [id number]   MEDICAL RECORD NO.:  [id number]          PATIENT TYPE:  REC   LOCATION:                                FACILITY:  [facility]   PHYSICIAN:  Vida Roller, M.D.   DATE OF BIRTH:  [birth year]   DATE OF PROCEDURE:  01/23/2005  DATE OF DISCHARGE:                                  ECHOCARDIOGRAM   PRIMARY CARE PHYSICIAN:  Dr. Marshia Ly NUMBER:  [id number]   TAPE COUNT:  [phone]   CLINICAL HISTORY:  This is a 77 year old woman with chest discomfort.  Previous echo from August 2004 shows normal LV systolic function, mild  aortic regurgitation, concentric left ventricular hypertrophy.  Today's  study is technically adequate.   M-MODE TRACINGS:  The aorta is 31 mm.   Left atrium is 42 mm.   Septum is 13 mm.   Posterior wall 12 mm.   Left ventricular diastolic dimension 40 mm.   Left ventricular systolic dimension 27 mm.   TWO-DIMENSIONAL AND DOPPLER IMAGING:  The left ventricle is normal size with  preserved LV systolic function, estimated ejection fraction 60-65%, there is  mild concentric left ventricular hypertrophy, no wall motion abnormalities  are seen.   The right ventricle is normal size with normal systolic function.   The left atrium is enlarged.  The right atrium appears to be normal in size.   The aortic valve is sclerotic.  There is trace to mild regurgitation.  No  stenosis is seen.   There is mild mitral regurgitation with mild annular calcification.   The tricuspid valve has trace regurgitation.   There is no pericardial effusion but there is some lucency in the anterior  portions of the ventricle consistent in the anterior fat pad.   The aortic root and the ascending aorta appear to be slightly enlarged in a  couple of views.  There is some fixed atheroma seen as well.   ASSESSMENT:  No obvious etiology for chest discomfort however the  abnormality in the aorta would lead to a CT scan for further  evaluation.      Vida Roller, M.D.  Electronically Signed     JH/MEDQ  D:  01/23/2005  T:  01/23/2005  Job:  [id number]

## 2010-09-07 NOTE — Discharge Summary (Signed)
NAME:  TWILIA, YAKLIN                      ACCOUNT NO.:  [id number]   MEDICAL RECORD NO.:  [id number]                   PATIENT TYPE:  INP   LOCATION:  A314                                 FACILITY:  [facility]   PHYSICIAN:  Patrica Duel, M.D.                 DATE OF BIRTH:  08-10-1925   DATE OF ADMISSION:  08/15/2003  DATE OF DISCHARGE:  08/17/2003                                 DISCHARGE SUMMARY   DISPOSITION:  (Transfer to [facility].)   DISCHARGE DIAGNOSES:  1. Severe anxiety/depression.  2. General debility and failure to thrive.  3. Insulin-dependent diabetes mellitus, well-controlled.  4. Severe degenerative disk disease of the lower back and status post     several procedures.  5. Hyperlipidemia.  6. Gastroesophageal reflux disease.  7. Hypertension.   HISTORY OF PRESENT ILLNESS:  For details regarding admission, please refer  to the admission note.  Briefly, this 77 year old female, with the above  history, had been discharged approximately 4 weeks prior to this admission  and spent 3 weeks at our skilled facility here at [facility]  She went home  approximately a week prior to this admission.  Since that time, she has  become increasingly agitated and anxious.  She is constantly complaining of  back pain and severe anxiety and was brought back to the emergency room for  further evaluation by her family.  They state they are totally unable to  care for her at home.   Her workup in the emergency department was essentially benign; she refused  to go home.  There were no neurologic deficits, chest pain, shortness of  breath, nausea, vomiting, diarrhea, genitourinary symptoms.  Endocrinologic  studies have been normal in the recent past; her weight is stable.   The patient was admitted for failure to thrive due to her severe anxiety  disorder, which was uncontrolled on multiple medications.   COURSE IN THE HOSPITAL:  The patient did well in the  hospital.  We have  increased her Xanax to 2 mg 4 times daily.  She will continue the  medications.  Of utmost importance is inpatient psychiatric care.  We have  been fortunate to make arrangements for her to be transferred to a unit in  [place] which is specifically designed for this purpose.   Her blood sugars are well-controlled, her vital signs are stable, she is  eating well and has had no further physical complaints.   MEDICATIONS:  Medications currently are:  1. Remeron 30 mg nightly.  2. Xanax 2 mg 4 times daily.  3. Lasix 20 mg b.i.d.  4. Reglan 5 mg t.i.d.  5. Lopressor 50 mg b.i.d.  6. Protonix 40 mg daily.  7. Aspirin 81 mg daily.  8. K-Dur 20 mEq daily.  9. Zocor 20 mg daily.   FOLLOWUP:  She will be followed and treated expectantly as an outpatient.     ___________________________________________  Patrica Duel, M.D.   MC/MEDQ  D:  08/17/2003  T:  08/17/2003  Job:  [id number]

## 2010-09-07 NOTE — Assessment & Plan Note (Signed)
[facility]                       [place] [facility] OFFICE NOTE   NAME:[name], [name]                   MRN:          [id number]  DATE:04/18/2006                            DOB:          12/28/1925    SUBJECTIVE:  [name] comes back today for followup of the following  issues.  1. Nonobstructive coronary disease.  She has chronic chest pain and is      having chest pain today.  It sounds much like esophageal      discomfort.  She has had several stricture dilatation in the past      by Dr. Karilyn Cota.  She is on Protonix.  She had a stress Myoview      January 23, 2005 which showed ejection fraction of 76% with no      ischemia.  She also had a 2-D echocardiogram which was essentially      unremarkable as well except for mild concentric left ventricular      hypertrophy.  2. Mild aortic root dilatation.  Dr. Dorethea Clan recommended a CT scan last      year for this.  She did not come because she was sick.  3. Hypertension.  4. Hyperlipidemia.   She is having chest pain in the center of her chest.  It happens most  commonly when she is lying down.  It is not exertion related.  She does  get some relief with nitroglycerin.  It is a chronic issue.  She is also  having problems with food sticking in her throat with regurgitation.   MEDICATIONS:  1. Xanax 2 mg p.o. b.i.d.  2. Protonix 40 mg a day.  3. Aspirin 81 mg a day.  4. Potassium 20 mEq a day.  5. Inderal 40 mg p.o. b.i.d.  6. Colace 2 mg at night.  7. Tylenol Arthritis one p.o. b.i.d.  8. Lipitor 10 mg at night.  9. Librax 5/2.5 mg b.i.d.   PHYSICAL EXAMINATION:  GENERAL:  She is in no acute distress.  She is  very pleasant.  VITAL SIGNS:  Blood pressure 128/82 in the right arm and 124/80 in the  left arm. Pulse 72 and regular.  Weight 158, up 13 from 14 months ago.  SKIN:  Skin color is normal.  She is warm and dry.  HEENT:  Normocephalic, atraumatic.  PERRLA.  Extraocular  movements  intact.  Sclerae clear.  Facial symmetry is normal.  Dentition is  satisfactory.  NECK:  Carotids upstrokes were equal without bruits.  No JVD.  Thyroid  is not enlarged.  Trachea midline.  LUNGS:  Clear.  HEART:  Nondisplaced PMI.  Normal S1 and S2.  There is no diastolic  murmur.  ABDOMEN:  Soft, good bowel sounds.  EXTREMITIES:  No edema.  Pulses are intact.  No sign of DVT.  NEUROLOGIC:  Intact.  MUSCULOSKELETAL:  Chronic arthritis changes.  Skin is grossly normal.   LABORATORY DATA:  EKG shows sinus rhythm with a first-degree AV block of  218 Msec.  This is stable.   ASSESSMENT/PLAN:  I have had about a 20-minute discussion with Mrs.  [name]  and her husband.  I do not think this is cardiogenic discomfort.  I think it is most likely gastroesophageal.   RECOMMENDATIONS:  1. Increase Protonix 40 mg p.o. b.i.d.  2. Make an appointment with Dr. Karilyn Cota.  3. Nitroglycerin p.r.n. which seems to relieve some of the spasms.  4. Will plan on seeing her back in a year.     Thomas C. Daleen Squibb, MD, [facility]  Electronically Signed    TCW/MedQ  DD: 04/18/2006  DT: 04/18/2006  Job #: [id number]   cc:   Patrica [name], M.D.  [name], M.D.

## 2010-09-07 NOTE — Procedures (Signed)
[facility]  Patient:    [name], [name]                   MRN: [id number] Proc. Date: 10/16/99 Adm. Date:  [id number] Attending:  Thyra Breed CC:         [name], M.D.                           Procedure Report  PROCEDURE:  Facet joint injection on the left side at L5-S1.  DIAGNOSIS:  Lumbar spondylosis with SI joint pain.  INTERVAL HISTORY:  The patient has noted that her SI joint pain is improved but now she has pain to the left of the midline in the lower back at the junction of the lumbosacral region. It is so severe that it is incapacitating her. Her medications are unchanged.  PHYSICAL EXAMINATION:  VITAL SIGNS:  Blood pressure 157/67, heart rate 65, respiratory rate 13, O2 saturations 98%, pain level is 8/10 and temperature is 96.9.  The patient shows good healing from a previous injection in the SI joint with minimal tenderness there. She exhibits tenderness in the region of the L5-S1 facet joint on the left side. Using fluoroscopic guidance, this confirmed the site of her discomfort. I discussed this with Ms. Marceaux and recommended a facet joint injection on that side.  She is willing to try this.  DESCRIPTION OF PROCEDURE:  After informed consent was obtained, the patient was placed in the prone position and monitored. I identified lumbar vertebrae L1 through L5 which showed a rotary scoliosis with marked osteophytic changes. The L5-S1 facet joint was clearly visible. This was optimized using the fluoroscope. A mark was made over the skin and the skin prepped with Betadine x 3 and draped. A 25 gauge needle was used to anesthetize the skin. A 25 gauge spinal needle was introduced down into the left L5-S1 facet joint. Aspiration was negative. I injected a 0.5 cc of 1% lidocaine followed by 20 mg of Medrol and a 0.5 cc of 1% lidocaine. The needle was flushed with 1% lidocaine and removed intact.  CONDITION POST PROCEDURE:   Stable.  DISCHARGE INSTRUCTIONS: 1. Resume previous diet. 2. Limitations in activities per instruction sheet. 3. Continue on current medications. 4. The patient has a scheduled follow-up which I encouraged her to keep    with regard to her SI joint injections. We will see how she reponds to    the facet joint injections. DD:  10/16/99 TD:  10/17/99 Job: [id number]

## 2010-09-07 NOTE — Consult Note (Signed)
NAME:  MARLEN, KOMAN                      ACCOUNT NO.:  [id number]   MEDICAL RECORD NO.:  [id number]                   PATIENT TYPE:  INP   LOCATION:  A215                                 FACILITY:  [facility]   PHYSICIAN:  Vida Roller, M.D.                DATE OF BIRTH:  June 19, 1925   DATE OF CONSULTATION:  DATE OF DISCHARGE:                                   CONSULTATION   PRIMARY CARE Ikey Omary:  Dr. Dionne Ano. Regino Schultze.   CARDIOLOGIST:  Dr. Gaylord Shih.   HISTORY OF PRESENT ILLNESS:  [name] is a 77 year old female who resides  at the [facility], who has known coronary artery disease on a recent heart  catheterization in October of 2003, with a 70% proximal LAD and a 50% mid  circumflex, 40% proximal RCA, normal left ventricular systolic function.  She has diabetes, hypertension and hyperlipidemia.  She began to have  substernal chest discomfort which required sublingual nitroglycerin to  control.  This was progressively worse over the last two to three months.  The last two to three weeks, she has had some significant what she calls  excruciating pain the middle of her chest.  She is currently pain free and  has been for the last 24 hours.  She feels actually reasonably well, on no  significant antianginal medications.  We talked at length about what to do.  I spoke with her on and I spoke with her regarding her options.  She  adamantly refuses to have a heart catheterization and does not want invasive  therapy including bypass surgery or angioplasty.   PAST MEDICAL HISTORY:  Significant for diabetes mellitus which is insulin-  dependent.  She has severe degenerative joint disease.  She has severe  spinal stenosis.  She has hypertension, increased lipids and a significant  amount of anxiety and depression.  She has also had two back surgeries.   Recent echocardiogram done in August of 2004 shows mild aortic  regurgitation, concentric left ventricular hypertrophy and an  ejection  fraction of 60%.   SOCIAL HISTORY:  She lives in the [facility].  She is a widow.  She has two  children who are both actively involved in her health care and have her  durable power of attorney.  She used to smoke but quit a number of years  ago.  She can walk, but she has to use a walker.   FAMILY HISTORY:  Her mother died of heart disease at age 48.  Her father  died of cancer at age 28.  She has one brother who died of prostate cancer  and four sisters, one of whom died from complications of diabetes.   REVIEW OF SYSTEMS:  Difficult to obtain due to her moderate dementia, but  she does have some difficulty with her vision.  She has headaches.  She has  chest pain, shortness of breath, she has stress  urinary incontinence, she  has a myriad of lower-extremity neurologic problems secondary to her severe  spinal stenosis.  She also has gastroesophageal reflux symptoms.   ALLERGIES:  MORPHINE, OXYCODONE, PREDNISONE, AND ADHESIVE TAPE.   MEDICATIONS IN HOSPITAL:  1. Sliding scale insulin.  2. Aspirin 325 mg a day.  3. K-Dur 20 mEq daily.  4. Protonix 40 mg a day.  5. Zocor 20 mg at night.  6. She takes Librax three times a day.  7. Inderal 40 mg twice a day.  8. Lasix 20 mg twice a day.  9. Mytrex 17 grams once a day.  10.      Remeron 30 mg at night to sleep.  11.      She takes Reglan 5 mg four times a day for her gastroparesis.  12.      She is on 70/30 insulin.   PHYSICAL EXAMINATION:  GENERAL:  She is an elderly white female, resting  comfortably in bed.  She is a difficult historian and is emotionally very  labile.  VITAL SIGNS:  Pulse 70, respiratory rate 20, blood pressure 133/64.  She is  afebrile.  HEENT:  Examination of the head, ears, eyes, nose and throat is  unremarkable.  NECK:  Supple.  No jugular venous distension or carotid bruits.  CHEST:  Clear to auscultation bilaterally.  CARDIOVASCULAR:  Exam is nondisplaced point of maximal impulse with no  lisps  or thrills, first and second heart sounds are normal.  There is no third or  fourth heart sounds.  No murmurs are noted.  ABDOMEN:  Soft, nontender, with normoactive bowel sounds.  GU/RECTAL:  Exam are deferred.  EXTREMITIES:  Without significant clubbing, cyanosis or edema.  She has  diffuse, decreased pulses in her lower extremity.  Her upper extremity  pulses are 1+.  NEUROLOGIC:  Her neurologic exam is grossly nonfocal.   Chest x-ray shows no acute abnormality.  Electrocardiogram shows sinus  rhythm at a rate of 69 with a normal axis and normal intervals.  She has  nonspecific ST-T wave changes.  There are no obvious Q waves anywhere.   LABORATORY DATA:  White blood cell count is 8.3, H&H 13 and 39 with platelet  count of 244,000.  Sodium 132, potassium 4.3.  Chloride 101, bicarbonate 28,  BUN 14, creatinine 0.8 and her blood sugar is 155.  Three sets of cardiac  enzymes are not consistent with acute myocardial infarction.  Her PTT is 32,  INR 1.1.  No D-Dimer was done.   IMPRESSION:  So, this is a woman with chest discomfort who has coronary  disease.  The description of her pain is atypical for angina.  However, she  does have known coronary disease and diabetes, so it is likely that her  chest discomfort has some component of angina.  She also has probably  progressive coronary disease because of her diabetes.  There has obviously  been no attempt to rule out a pulmonary embolus, and this is a woman who is  at relatively high risk for a  pulmonary embolus, and consideration may be  made for a D-Dimer.  If this is abnormal, I think I would recommend a CT  scan to rule that out.   Hypertension and diabetes are the other two diagnoses, both of which appear  to be reasonably well managed.   I had a detailed discussion with her son, and I also talked with her at  length what she wanted done.  She adamantly refuses to have a heart catheterized.  Her son agrees with her.   They do not want either noninvasive  or invasive assessment.  Their preference is just to treat this medically if  possible.  I think there is plenty of room for antianginal medications.   RECOMMENDATIONS:  1. Stop her Inderal for now as that is not a great antianginal medication,     and try something that is a little bit more cardio specific.  Let's try     Lopressor at 50 b.i.d.  If her blood pressure is stable at that     adjustment and that dose, I think Imdur can be added as an antianginal as     well.  She probably would do reasonably well with those two medications.     Obviously calcium channel blockers can also be used.  There are a variety     of other options, but at this point, let's try beta-blocker and nitrates.  2. As far as her diabetes and hypertension, and her hyperlipidemia, those     have been addressed reasonably well.      ___________________________________________                                            Vida Roller, M.D.   JH/MEDQ  D:  07/18/2003  T:  07/18/2003  Job:  [id number]

## 2010-09-07 NOTE — H&P (Signed)
NAME:  [name], [name]                      ACCOUNT NO.:  [id number]   MEDICAL RECORD NO.:  [id number]                   PATIENT TYPE:  INP   LOCATION:  A314                                 FACILITY:  [facility]   PHYSICIAN:  Patrica Duel, M.D.                 DATE OF BIRTH:  29-Apr-1925   DATE OF ADMISSION:  08/15/2003  DATE OF DISCHARGE:                                HISTORY & PHYSICAL   CHIEF COMPLAINT:  Nerves and back pain.   HISTORY OF PRESENT ILLNESS:  This is a 77 year old female with a  longstanding history of severe anxiety and depression.  She has been  committed several times in the remote past for inpatient care.  She also has  a history of insulin-dependent diabetes mellitus, severe degenerative disk  disease of the back and is status post several procedures.  She has history  of hyperlipidemia.  Gastroesophageal reflux disease and hypertension, all  compensated.   The patient was discharged from the hospital approximately 4 weeks ago and  transferred to the [facility] for rehab and ongoing care.  She went home  approximately 1 week ago.  Since that time she has become increasingly  agitated and anxious.  She is constantly complaining of back pain and severe  anxiety; and was brought back to the emergency room for further evaluation  and therapy.  Workup in the emergency department was essentially benign.  She refused to go home.  There is no history of any neurologic deficits,  chest pain, shortness of breath, nausea, vomiting, diarrhea, or  genitourinary symptoms.  Endocrinologic studies have been normal in the  recent past.  Her weight is stable.   The patient is admitted for failure to thrive due to severe anxiety disorder  which is currently uncontrolled on multiple meds and noted below.  By my  assessment she is completely incapable of an independent existence.  Of  note, she has an attentive family who has done their best to care for her  and have been  unsuccessful.   CURRENT MEDICATIONS:  1. Remeron 30 mg q.h.s.  2. Xanax 2 mg q.i.d.  3. Lasix 20 mg b.i.d.  4. Reglan 5 mg t.i.d. a.c.  5. Lopressor 50 b.i.d.  6. Protonix 40 daily.  7. Aspirin 81 daily.  8. K-Dur 20 daily.  9. Zocor 20 daily.   ALLERGIES:  Betadine and steroids.   PAST MEDICAL HISTORY:  As noted above.   SOCIAL HISTORY:  She lives alone; is a nonsmoker, nondrinker.   FAMILY HISTORY:  Noncontributory.   REVIEW OF SYSTEMS:  Negative except as mentioned.   PHYSICAL EXAMINATION:  GENERAL:  This is an anxious female who is somewhat  tremulous, but alert and oriented x3.  HEENT:  Normocephalic, atraumatic.  The pupils are equal.  Ears, nose, and  throat are benign.  NECK:  Neck is supple.  LUNGS:  Lungs are clear.  HEART:  Heart sounds are normal without murmurs, rubs, or gallops.  ABDOMEN:  Nontender, nondistended.  Bowel sounds are intact.  EXTREMITIES:  No clubbing, cyanosis, or edema.   ASSESSMENT:  Severe uncontrolled anxiety disorder in a 77 year old female  with a significant medical history as noted above.  Currently unable to care  for herself.   PLAN:  Admitted for psychiatric evaluation and placement planning.  A  domiciliary unit would be appropriate; however, inpatient care at a  psychiatric facility would also be appropriate if this can be arranged.     ___________________________________________                                         Patrica Duel, M.D.   MC/MEDQ  D:  08/16/2003  T:  08/16/2003  Job:  [id number]

## 2010-09-07 NOTE — H&P (Signed)
[facility]  Patient:    [name], [name]                   MRN: [id number] Adm. Date:  [id number] Attending:  Thyra Breed CC:         Dr. Jodelle Green, [facility]  Marlan Palau, M.D.   History and Physical  FOLLOW-UP EVALUATION  HISTORY OF PRESENT ILLNESS:  Crystal comes in for follow-up evaluation of her lumbar spondylosis with chronic low back pain.  Her facet blocks resulted in only temporary relief at best.  She is still awaiting an appointment with Sharon Seller A. Hey, M.D., which she has had some difficulties arranging due to some telephone number changes.  The patient notes that her pain is no better.  She actually rates it as a level of 7/10.  She states that in the past she has been intolerance of nonsteroidals because of her stomach, opiates because of her gastrointestinal tract and underlying diverticulitis, and Neurontin caused burning of her mouth.  She states that most of her pain is in her lower back in the lumbosacral region and down into the lateral aspects of her calves.  She notes that laying down with her feet up helps to reduce her discomfort and standing in one place or walking increases her discomfort.  MEDICATIONS:  Current medications include Insulin 70/30 26 units in the morning and 15 units in in the evening, Xanax 0.5 mg at night, Inderal 40 mg twice a day, Librax three times a day, Lozol, Tylenol for pain relief, Citrucel, albuterol, Atrovent, Protonix, K-Dur, and Lasix.  PHYSICAL EXAMINATION:  The blood pressure is 137/62, heart rate 73, respiratory rate 12, O2 saturation 96%, pain level 7/10, and temperature 98.1 degrees.  The patient demonstrated straight leg raise signs with symmetric deep tendon reflexes.  She had increased pain on hyperextension of her back to about 20 degrees and forward flexion to about 30 degrees.  She had tenderness at the lower lumbar facet regions.  IMPRESSION: 1. Low back pain  predominantly on the basis of facet joint arthropathy, status    post decompressive laminectomy by Hewitt Shorts, M.D., in June of 2000    with known underlying left L5 radiculopathy and perineal neuropathy. 2. Other medical problems per primary care physician, Dr. Jodelle Green at [facility].  DISPOSITION: 1. I discussed with the patient the possibility of trying tricyclics in low    dose.  She has been on Elavil in the past, but she got quite a dry mouth    and is very reluctant to go that route.  After discussing in detail, she is    willing to try Pamelor 10 mg one p.o. q.p.m. for at least two weeks to see    whether this helps.  If it is helping partially, she plans to double this.    If it does resolve her pain to a significant extend, then she will continue    on 10 mg.  She was given a prescription for #30 with two refills. 2. Follow up with me in six weeks. 3. In the meantime, we will continue to pursue a consultative evaluation with    Sharon Seller A. Hey, M.D., to see whether there are any further surgical    interventions which may be of benefit. DD:  01/02/00 TD:  01/02/00 Job: [id number]

## 2011-02-06 LAB — H. PYLORI ANTIBODY, IGG: H Pylori IgG: 0.5

## 2011-04-19 ENCOUNTER — Encounter: Payer: Self-pay | Admitting: Cardiology

## 2011-05-09 ENCOUNTER — Encounter (INDEPENDENT_AMBULATORY_CARE_PROVIDER_SITE_OTHER): Payer: Self-pay | Admitting: *Deleted

## 2011-05-14 ENCOUNTER — Telehealth (INDEPENDENT_AMBULATORY_CARE_PROVIDER_SITE_OTHER): Payer: Self-pay | Admitting: *Deleted

## 2011-05-14 NOTE — Telephone Encounter (Signed)
[name] called and would like to schedule a TCS. Her PCP from [facility] spoke with Dr. Karilyn Cota (Per Wynona Canes) and was told to call and get the tcs schedule. Patient's return phone number is [phone].

## 2011-05-15 ENCOUNTER — Telehealth (INDEPENDENT_AMBULATORY_CARE_PROVIDER_SITE_OTHER): Payer: Self-pay | Admitting: *Deleted

## 2011-05-15 ENCOUNTER — Other Ambulatory Visit (INDEPENDENT_AMBULATORY_CARE_PROVIDER_SITE_OTHER): Payer: Self-pay | Admitting: *Deleted

## 2011-05-15 DIAGNOSIS — Z86010 Personal history of colonic polyps: Secondary | ICD-10-CM

## 2011-05-15 NOTE — Telephone Encounter (Signed)
TCS sch'd 07/10/11, patient aware

## 2011-05-15 NOTE — Telephone Encounter (Signed)
Patient needs movi prep 

## 2011-05-17 MED ORDER — PEG-KCL-NACL-NASULF-NA ASC-C 100 G PO SOLR: 1.0000 | Freq: Once | ORAL | Status: DC

## 2011-06-03 ENCOUNTER — Encounter (INDEPENDENT_AMBULATORY_CARE_PROVIDER_SITE_OTHER): Payer: Self-pay | Admitting: *Deleted

## 2011-06-19 ENCOUNTER — Telehealth (INDEPENDENT_AMBULATORY_CARE_PROVIDER_SITE_OTHER): Payer: Self-pay | Admitting: *Deleted

## 2011-06-19 NOTE — Telephone Encounter (Signed)
agree

## 2011-06-19 NOTE — Telephone Encounter (Signed)
Requesting MD/PCP:  mcgough     Name & DOB: [name] 04-20-2026     Procedure: TCS  Reason/Indication:  Hx polyps  Has patient had this procedure before?  yes  If so, when, by whom and where?  2008  Is there a family history of colon cancer?  Patient not sure  Who?  What age when diagnosed?    Is patient diabetic?   yes      Does patient have prosthetic heart valve?  no  Do you have a pacemaker?  no  Has patient had joint replacement within last 12 months?  no  Is patient on Coumadin, Plavix and/or Aspirin? yes  Medications: losartan 50 mg daily, humulin 70/30 bid b/f meals, protonix 40 mg daily, humalog 100 unit Q 6 hrs b/f meals, furosemide 40 mg daily, alprazolam 2 mg tid, albuterol 650 1-2 tab Q 6 hrs, potassium 20 meq daily, amlodipine 2.5 mg daily, cod liver oil daily  Allergies: prednisone, steroids  Pharmacy: [facility]  Medication Adjustment: per Terri: 1/2 normal dose of humulin day before, hold humalog day of procedure, asa 2 days  Procedure date & time: 07/11/11 at 730

## 2011-07-11 ENCOUNTER — Encounter (HOSPITAL_COMMUNITY): Admission: RE | Payer: Self-pay | Source: Ambulatory Visit

## 2011-07-11 ENCOUNTER — Ambulatory Visit (HOSPITAL_COMMUNITY): Admission: RE | Admit: 2011-07-11 | Payer: Medicare Other | Source: Ambulatory Visit | Admitting: Internal Medicine

## 2011-07-11 SURGERY — COLONOSCOPY
Anesthesia: Moderate Sedation

## 2012-01-07 ENCOUNTER — Institutional Professional Consult (permissible substitution): Payer: Medicare Other | Admitting: Internal Medicine

## 2012-01-24 ENCOUNTER — Encounter: Payer: Self-pay | Admitting: Internal Medicine

## 2012-01-24 ENCOUNTER — Other Ambulatory Visit (INDEPENDENT_AMBULATORY_CARE_PROVIDER_SITE_OTHER): Payer: Medicare Other

## 2012-01-24 ENCOUNTER — Ambulatory Visit (INDEPENDENT_AMBULATORY_CARE_PROVIDER_SITE_OTHER)
Admission: RE | Admit: 2012-01-24 | Discharge: 2012-01-24 | Disposition: A | Discharge status: Home / Self Care | Payer: Medicare Other | Source: Ambulatory Visit | Attending: Internal Medicine | Admitting: Internal Medicine

## 2012-01-24 ENCOUNTER — Ambulatory Visit (INDEPENDENT_AMBULATORY_CARE_PROVIDER_SITE_OTHER): Payer: Medicare Other | Admitting: Internal Medicine

## 2012-01-24 VITALS — BP 120/70 | HR 78 | Temp 97.8°F | Ht 62.0 in | Wt 171.8 lb

## 2012-01-24 DIAGNOSIS — R0602 Shortness of breath: Secondary | ICD-10-CM

## 2012-01-24 DIAGNOSIS — I10 Essential (primary) hypertension: Secondary | ICD-10-CM

## 2012-01-24 DIAGNOSIS — R05 Cough: Secondary | ICD-10-CM | POA: Insufficient documentation

## 2012-01-24 LAB — BASIC METABOLIC PANEL
CO2: 29 mEq/L (ref 19–32)
Calcium: 9 mg/dL (ref 8.4–10.5)
Creatinine, Ser: 0.8 mg/dL (ref 0.4–1.2)
GFR: 75.89 mL/min (ref >60.00)
Glucose, Bld: 86 mg/dL (ref 70–99)

## 2012-01-24 LAB — CBC WITH DIFFERENTIAL/PLATELET
Basophils Absolute: 0 10*3/uL (ref 0.0–0.1)
Eosinophils Absolute: 0.3 10*3/uL (ref 0.0–0.7)
Lymphocytes Relative: 26.2 % (ref 12.0–46.0)
MCHC: 33.5 g/dL (ref 30.0–36.0)
MCV: 96.3 fl (ref 78.0–100.0)
Monocytes Absolute: 0.6 10*3/uL (ref 0.1–1.0)
Neutrophils Relative %: 62.6 % (ref 43.0–77.0)
Platelets: 207 10*3/uL (ref 150.0–400.0)
WBC: 8.8 10*3/uL (ref 4.5–10.5)

## 2012-01-24 LAB — TSH: TSH: 1.12 u[IU]/mL (ref 0.35–5.50)

## 2012-01-24 MED ORDER — FAMOTIDINE 20 MG PO TABS: ORAL_TABLET | ORAL | Status: DC

## 2012-01-24 NOTE — Assessment & Plan Note (Addendum)
-   Assoc with hoarseness and freq "pna" with abnormal esophagram 2008  The most common causes of chronic cough in immunocompetent adults include the following: upper airway cough syndrome (UACS), previously referred to as postnasal drip syndrome (PNDS), which is caused by variety of rhinosinus conditions; (2) asthma; (3) GERD; (4) chronic bronchitis from cigarette smoking or other inhaled environmental irritants; (5) nonasthmatic eosinophilic bronchitis; and (6) bronchiectasis.   These conditions, singly or in combination, have accounted for up to 94% of the causes of chronic cough in prospective studies.   Other conditions have constituted no >6% of the causes in prospective studies These have included bronchogenic carcinoma, chronic interstitial pneumonia, sarcoidosis, left ventricular failure, ACEI-induced cough, and aspiration from a condition associated with pharyngeal dysfunction.  Most likley this is  Classic Upper airway cough syndrome, so named because it's frequently impossible to sort out how much is  CR/sinusitis with freq throat clearing (which can be related to primary GERD)   vs  causing  secondary (" extra esophageal")  GERD from wide swings in gastric pressure that occur with throat clearing, often  promoting self use of mint and menthol lozenges that reduce the lower esophageal sphincter tone and exacerbate the problem further in a cyclical fashion.   These are the same pts (now being labeled as having "irritable larynx syndrome" by some cough centers) who not infrequently have a history of having failed to tolerate ace inhibitors,  dry powder inhalers or biphosphonates or report having atypical reflux symptoms that don't respond to standard doses of PPI , and are easily confused as having aecopd or asthma flares by even experienced allergists/ pulmonologists.   For now max gerd rx and diet and off ccb  and next step is sinus ct if not improving

## 2012-01-24 NOTE — Assessment & Plan Note (Signed)
Stop amlodipine on a trial basis  01/24/2012 due to dysphagia and edema

## 2012-01-24 NOTE — Patient Instructions (Addendum)
Stop cod liver oil  And amlodipine and just use your nebulizer as needed if you can't catch your breath  Continue protonix 40 mg Take 30-60 min before first meal of the day and add Pepcid 20 mg one at bedtime  GERD (REFLUX)  is an extremely common cause of respiratory symptoms, many times with no significant heartburn at all.    It can be treated with medication, but also with lifestyle changes including avoidance of late meals, excessive alcohol, smoking cessation, and avoid fatty foods, chocolate, peppermint, colas, red wine, and acidic juices such as orange juice.  NO MINT OR MENTHOL PRODUCTS SO NO COUGH DROPS  USE SUGARLESS CANDY INSTEAD (jolley ranchers or Stover's)  NO OIL BASED VITAMINS - use powdered substitutes.  Please remember to go to the lab and x-ray department downstairs for your tests - we will call you with the results when they are available.    Please schedule a follow up office visit in 4 weeks, sooner if needed

## 2012-01-24 NOTE — Progress Notes (Signed)
  Subjective:    Patient ID: [name], female    DOB: 1925/07/04  MRN: [id number]  HPI  63 yowf never smoker with recurrent pna "yearly" starting around 2000 with dx of copd on neb with worsening since 2011 so referred 01/24/2012 to pulmonary clinic by Dr Regino Schultze  01/24/2012 1st pulmonary eval cc  cough worse x 2 years with daily cough worse in ams > sev tbsp yellow and sometimes bloody some better p abx - does lose breath with cough but with activity  Knees/ back and stop her before breathing.  No obvious daytime variabilty or assoc chronic cough or cp or chest tightness, subjective wheeze overt sinus or hb symptoms. No unusual exp hx or h/o childhood pna/ asthma or premature birth to her knowledge.   Sleeping ok without nocturnal  of respiratory  c/o's or need for noct saba. Also denies any obvious fluctuation of symptoms with weather or environmental changes or other aggravating or alleviating factors except as outlined above       Review of Systems  Constitutional: Negative for fever and unexpected weight change.  HENT: Negative for ear pain, nosebleeds, congestion, sore throat, rhinorrhea, sneezing, trouble swallowing, dental problem, postnasal drip and sinus pressure.   Eyes: Negative for redness and itching.  Respiratory: Negative for cough, chest tightness, shortness of breath and wheezing.   Cardiovascular: Negative for palpitations and leg swelling.  Gastrointestinal: Negative for nausea and vomiting.  Genitourinary: Negative for dysuria.  Musculoskeletal: Negative for joint swelling.  Skin: Negative for rash.  Neurological: Negative for headaches.  Hematological: Does not bruise/bleed easily.  Psychiatric/Behavioral: Negative for dysphoric mood. The patient is not nervous/anxious.        Objective:   Physical Exam  86 yowf very frail, amb with walking cane, very hoarse,  failed to answer a single question asked in a straightforward manner, tending to go off on  tangents or answer questions with ambiguous medical terms or diagnoses and seemed aggravated  when asked the same question more than once for clarification.   HEENT: nl dentition, turbinates, and orophanx. Nl external ear canals without cough reflex   NECK :  without JVD/Nodes/TM/ nl carotid upstrokes bilaterally   LUNGS: no acc muscle use, clear to A and P bilaterally without cough on insp or exp maneuvers   CV:  RRR  no s3 or murmur or increase in P2,  1- 2+ bilateral edema despite TEDS  ABD:  soft and nontender with nl excursion in the supine position. No bruits or organomegaly, bowel sounds nl  MS:  warm without deformities, calf tenderness, cyanosis or clubbing  SKIN: warm and dry without lesions    NEURO:  alert, approp, no deficits   2/4/8  Esophagram Moderate diffuse impairment of esophageal motility as above.  Mild relative narrowing of gastroesophageal junction but this does not obstruct  a 12.5 mm diameter barium tablet  CXR  01/24/2012 : Moderate cardiac silhouette enlargement. No pulmonary edema, pneumonia, or pleural effusion. Chronic vascular and skeletal changes are described above.  Labs 01/24/12 ok including hct, hco3 and bnp/ tsh       Assessment & Plan:

## 2012-01-27 ENCOUNTER — Telehealth: Payer: Self-pay | Admitting: Internal Medicine

## 2012-01-27 NOTE — Progress Notes (Signed)
Quick Note:  Spoke with pt and notified of results per Dr. Wert. Pt verbalized understanding and denied any questions.  ______ 

## 2012-01-27 NOTE — Telephone Encounter (Signed)
Verlon Au has called and spoke with pt and she is aware of her lab and cxr results per MW.  Will sign off of phone note.

## 2012-01-31 ENCOUNTER — Telehealth: Payer: Self-pay | Admitting: Internal Medicine

## 2012-01-31 NOTE — Telephone Encounter (Signed)
Called, spoke with pt.  She would like to change from [name] to another pulmonary dr.  Andrey Cota "I was so disgusted with the service I received on Friday."  She believes there is something "terrible wrong with my lungs" and doesn't believe [name] listened to her.  She does not have a preference for another doctor in our office.  She is aware of office protocol on switching drs -- [name], pls advise if you are ok with this.  Thank you.

## 2012-02-01 NOTE — Telephone Encounter (Signed)
Both I and my Nurse noted (and I documented) that we struggled with getting her to tell us what was bothering her.   She failed to answer a single question asked in a straightforward manner, tending to go off on tangents or answer questions with ambiguous medical terms or diagnoses and seemed aggravated  when asked the same question more than once for clarification.   All of the studies we did were normal, the recs we made were empiric on the basis of a very limited hx( because she refused to answer our questions), and I suspect her biggest problem is anxiety though we only use this as a dx of exclusion.  It would be best if Dr Regino Schultze would refer her to another pulmonary group or medical center because I cannot ask any of my partners in good faith to assume her care.  Please send Dr Regino Schultze a copy of this note

## 2012-02-03 NOTE — Telephone Encounter (Signed)
I spoke with [name] about this and he advised to offer the pt an appt with another MD. I called the pt to offer her an appt with another MD but pt refused stating that she "will just deal with her problems and follow-up with her primary care doctor if needed." I advised the pt that we will be more then happy to schedule her with another doctor. The patient again refused and stated she just does not feel the need to see another doctor. Carron Curie, CMA

## 2012-02-24 ENCOUNTER — Ambulatory Visit: Payer: Medicare Other | Admitting: Internal Medicine

## 2013-01-14 ENCOUNTER — Other Ambulatory Visit (HOSPITAL_COMMUNITY): Payer: Self-pay | Admitting: Family Medicine

## 2013-01-14 DIAGNOSIS — E669 Obesity, unspecified: Secondary | ICD-10-CM

## 2013-01-14 DIAGNOSIS — Z139 Encounter for screening, unspecified: Secondary | ICD-10-CM

## 2013-01-14 DIAGNOSIS — I10 Essential (primary) hypertension: Secondary | ICD-10-CM

## 2013-01-14 DIAGNOSIS — E119 Type 2 diabetes mellitus without complications: Secondary | ICD-10-CM

## 2013-01-20 ENCOUNTER — Other Ambulatory Visit (HOSPITAL_COMMUNITY): Payer: Medicare Other

## 2013-02-17 ENCOUNTER — Ambulatory Visit (INDEPENDENT_AMBULATORY_CARE_PROVIDER_SITE_OTHER): Payer: Medicare Other | Admitting: Cardiovascular Disease

## 2013-02-17 ENCOUNTER — Encounter: Payer: Self-pay | Admitting: Cardiovascular Disease

## 2013-02-17 VITALS — BP 115/75 | HR 77 | Ht 61.0 in | Wt 165.0 lb

## 2013-02-17 DIAGNOSIS — I77810 Thoracic aortic ectasia: Secondary | ICD-10-CM

## 2013-02-17 DIAGNOSIS — I059 Rheumatic mitral valve disease, unspecified: Secondary | ICD-10-CM

## 2013-02-17 DIAGNOSIS — I2510 Atherosclerotic heart disease of native coronary artery without angina pectoris: Secondary | ICD-10-CM

## 2013-02-17 DIAGNOSIS — E785 Hyperlipidemia, unspecified: Secondary | ICD-10-CM

## 2013-02-17 DIAGNOSIS — I340 Nonrheumatic mitral (valve) insufficiency: Secondary | ICD-10-CM

## 2013-02-17 DIAGNOSIS — I10 Essential (primary) hypertension: Secondary | ICD-10-CM

## 2013-02-17 NOTE — Progress Notes (Signed)
Patient ID: [name], female   DOB: 17-Aug-1925, 79 y.o.   MRN: [id number]       CARDIOLOGY CONSULT NOTE  Patient ID: [name] MRN: [id number] DOB/AGE: 06/13/1925 79 y.o.  Admit date: (Not on file) Primary Physician Kirk Ruths, MD  Reason for Consultation: CAD  HPI: [name] is an 79 year old with a PMH which includes HTN, IDDM, hyperlipidemia, and nonobstructive CAD, along with mild aortic root dilatation and mild mitral regurgitation.  Her last stress Myoview was on April 27, 2007, which showed no ischemia, normal  contractility, and normal thickening of all areas of myocardium, EF 81%.  I do not have her cath report, but she thought she may have needed stents but has been managed medically and has done well.  She lives by herself and is extremely independent. She takes care of  all her personal needs, including her medical needs. She is very  compliant.   She could not tolerate Lipitor in the past because of muscle aches in her legs.   She has COPD and has a chronic cough deemed secondary to GERD, and sees Dr. Sherene Sires.  She had an episode of chest pain 6 weeks ago which she took 3 SL nitro for, but has not had any recurrences. She very seldom gets chest pain.     Allergies  Allergen Reactions  . Percocet [Oxycodone-Acetaminophen]   . Prednisone     Current Outpatient Prescriptions  Medication Sig Dispense Refill  . acetaminophen (TYLENOL) 650 MG CR tablet Take 650 mg by mouth as needed.        Marland Kitchen albuterol (PROVENTIL) (2.5 MG/3ML) 0.083% nebulizer solution Take 2.5 mg by nebulization 4 (four) times daily.       Marland Kitchen alprazolam (XANAX) 2 MG tablet Take 2 mg by mouth as directed.        . famotidine (PEPCID) 20 MG tablet One at bedtime  30 tablet  11  . furosemide (LASIX) 40 MG tablet Take 20 mg by mouth daily.        . insulin lispro (HUMALOG) 100 UNIT/ML injection Inject into the skin as directed.        . insulin NPH-insulin regular (NOVOLIN  70/30) (70-30) 100 UNIT/ML injection Inject into the skin as directed.        Marland Kitchen ipratropium (ATROVENT) 0.02 % nebulizer solution Take 500 mcg by nebulization 4 (four) times daily.      Marland Kitchen losartan (COZAAR) 50 MG tablet Take 50 mg by mouth daily.        . Multiple Vitamins-Minerals (PROTEGRA PO) Take 1 tablet by mouth daily.        . nebivolol (BYSTOLIC) 10 MG tablet Take 10 mg by mouth daily.        . pantoprazole (PROTONIX) 40 MG tablet Take 40 mg by mouth daily.        . peg 3350 powder (MOVIPREP) 100 G SOLR Take 1 kit (100 g total) by mouth once.  1 kit  0  . potassium chloride SA (K-DUR,KLOR-CON) 20 MEQ tablet Take 20 mEq by mouth daily.         No current facility-administered medications for this visit.    Past Medical History  Diagnosis Date  . Coronary artery disease   . Hyperlipidemia   . Hypertension   . Mitral regurgitation   . Asthma   . Stroke   . Chronic headaches     Past Surgical History  Procedure Laterality Date  . Back  surgery    . Cholecystectomy  1994  . Rotator cuff repair    . Partial hysterectomy  1973  . Appendectomy  1942    History   Social History  . Marital Status: Widowed    Spouse Name: N/A    Number of Children: N/A  . Years of Education: N/A   Occupational History  . Retired    Social History Main Topics  . Smoking status: Former Games developer  . Smokeless tobacco: Not on file     Comment: pt states she only smoked 2 cigs a day x 3months.   . Alcohol Use: No  . Drug Use: No  . Sexual Activity: Not on file   Other Topics Concern  . Not on file   Social History Narrative   Exercises regularly     Family History  Problem Relation Age of Onset  . Coronary artery disease Mother   . Diabetes Father   . COPD Father      Prior to Admission medications   Medication Sig Start Date End Date Taking? Authorizing Provider  acetaminophen (TYLENOL) 650 MG CR tablet Take 650 mg by mouth as needed.      Historical Provider, MD  albuterol  (PROVENTIL) (2.5 MG/3ML) 0.083% nebulizer solution Take 2.5 mg by nebulization 4 (four) times daily.     Historical Provider, MD  alprazolam Prudy Feeler) 2 MG tablet Take 2 mg by mouth as directed.      Historical Provider, MD  famotidine (PEPCID) 20 MG tablet One at bedtime 01/24/12 01/23/13  Nyoka Cowden, MD  furosemide (LASIX) 40 MG tablet Take 20 mg by mouth daily.      Historical Provider, MD  insulin lispro (HUMALOG) 100 UNIT/ML injection Inject into the skin as directed.      Historical Provider, MD  insulin NPH-insulin regular (NOVOLIN 70/30) (70-30) 100 UNIT/ML injection Inject into the skin as directed.      Historical Provider, MD  ipratropium (ATROVENT) 0.02 % nebulizer solution Take 500 mcg by nebulization 4 (four) times daily.    Historical Provider, MD  losartan (COZAAR) 50 MG tablet Take 50 mg by mouth daily.      Historical Provider, MD  Multiple Vitamins-Minerals (PROTEGRA PO) Take 1 tablet by mouth daily.      Historical Provider, MD  nebivolol (BYSTOLIC) 10 MG tablet Take 10 mg by mouth daily.      Historical Provider, MD  pantoprazole (PROTONIX) 40 MG tablet Take 40 mg by mouth daily.      Historical Provider, MD  peg 3350 powder (MOVIPREP) 100 G SOLR Take 1 kit (100 g total) by mouth once. 05/15/11   Len Blalock, NP  potassium chloride SA (K-DUR,KLOR-CON) 20 MEQ tablet Take 20 mEq by mouth daily.      Historical Provider, MD     Review of systems complete and found to be negative unless listed above in HPI  BP 115/75 Pulse 77     Physical exam  General: NAD Neck: No JVD, no thyromegaly or thyroid nodule.  Lungs: Clear to auscultation bilaterally with normal respiratory effort. CV: Nondisplaced PMI.  Heart regular S1/S2, no S3/S4, II/VI holosystolic murmur at LSB.  Trace peripheral edema, wearing compression stockings..  No carotid bruit.  Normal pedal pulses.  Abdomen: Soft, nontender, no hepatosplenomegaly, no distention.  Skin: Intact without lesions or rashes.    Neurologic: Alert and oriented x 3.  Psych: Normal affect. Extremities: No clubbing or cyanosis.  HEENT: Normal.   Labs:  Lab Results  Component Value Date   WBC 8.8 01/24/2012   HGB 13.4 01/24/2012   HCT 40.1 01/24/2012   MCV 96.3 01/24/2012   PLT 207.0 01/24/2012   No results found for this basename: NA, K, CL, CO2, BUN, CREATININE, CALCIUM, LABALBU, PROT, BILITOT, ALKPHOS, ALT, AST, GLUCOSE,  in the last 168 hours No results found for this basename: CKTOTAL, CKMB, CKMBINDEX, TROPONINI    No results found for this basename: CHOL   No results found for this basename: HDL   No results found for this basename: LDLCALC   No results found for this basename: TRIG   No results found for this basename: CHOLHDL   No results found for this basename: LDLDIRECT       EKG: Sinus rhythm,1st degree AV block, PR 240 ms, late R wave transition   ASSESSMENT AND PLAN: 1. CAD: other than one episode of chest pain, she very seldom experiences this. She is on adequate medical therapy. 2. HTN: controlled on Bystolic and Cozaar. 3. Hyperlipidemia: did not tolerate statins in past. On fish oil. 4. Mild aortic root dilatation: continue to monitor. No diastolic murmur appreciated. 5. Mild mitral regurgitation: symptomatically stable.    Signed: Prentice Docker, M.D., F.A.C.C.  02/17/2013, 12:55 PM

## 2013-02-17 NOTE — Patient Instructions (Signed)
Your physician recommends that you schedule a follow-up appointment in: 6 months. You will receive a reminder letter in the mail in about 10 months reminding you to call and schedule your appointment. If you don't receive this letter, please contact our office. Your physician recommends that you continue on your current medications as directed. Please refer to the Current Medication list given to you today.

## 2013-07-26 ENCOUNTER — Other Ambulatory Visit (HOSPITAL_COMMUNITY): Payer: Self-pay | Admitting: Family Medicine

## 2013-07-26 DIAGNOSIS — Z01419 Encounter for gynecological examination (general) (routine) without abnormal findings: Secondary | ICD-10-CM

## 2013-08-04 ENCOUNTER — Other Ambulatory Visit (HOSPITAL_COMMUNITY): Payer: Medicare Other

## 2013-08-04 ENCOUNTER — Ambulatory Visit (HOSPITAL_COMMUNITY)
Admission: RE | Admit: 2013-08-04 | Discharge: 2013-08-04 | Disposition: A | Discharge status: Home / Self Care | Payer: Medicare Other | Source: Ambulatory Visit | Attending: Family Medicine | Admitting: Family Medicine

## 2013-08-04 DIAGNOSIS — Z0000 Encounter for general adult medical examination without abnormal findings: Secondary | ICD-10-CM | POA: Insufficient documentation

## 2013-08-04 DIAGNOSIS — Z01419 Encounter for gynecological examination (general) (routine) without abnormal findings: Secondary | ICD-10-CM

## 2013-08-11 ENCOUNTER — Encounter: Payer: Self-pay | Admitting: Cardiovascular Disease

## 2013-08-11 ENCOUNTER — Ambulatory Visit (INDEPENDENT_AMBULATORY_CARE_PROVIDER_SITE_OTHER): Payer: Medicare Other | Admitting: Cardiovascular Disease

## 2013-08-11 VITALS — BP 118/70 | HR 84 | Ht 61.0 in | Wt 165.0 lb

## 2013-08-11 DIAGNOSIS — I77810 Thoracic aortic ectasia: Secondary | ICD-10-CM

## 2013-08-11 DIAGNOSIS — I059 Rheumatic mitral valve disease, unspecified: Secondary | ICD-10-CM

## 2013-08-11 DIAGNOSIS — I10 Essential (primary) hypertension: Secondary | ICD-10-CM

## 2013-08-11 DIAGNOSIS — I2510 Atherosclerotic heart disease of native coronary artery without angina pectoris: Secondary | ICD-10-CM

## 2013-08-11 DIAGNOSIS — I340 Nonrheumatic mitral (valve) insufficiency: Secondary | ICD-10-CM

## 2013-08-11 DIAGNOSIS — E785 Hyperlipidemia, unspecified: Secondary | ICD-10-CM

## 2013-08-11 NOTE — Progress Notes (Signed)
Patient ID: [name], female   DOB: 1925/08/07, 80 y.o.   MRN: [id number]      SUBJECTIVE: [name] is an 80 year old with a PMH which includes HTN, IDDM, hyperlipidemia, and nonobstructive CAD, along with mild aortic root dilatation and mild mitral regurgitation.  Her last stress Myoview was on April 27, 2007, which showed no ischemia, normal  contractility, and normal thickening of all areas of myocardium, EF 81%.  I do not have her cath report, but she thought she may have needed stents but has been managed medically and has done relatively well.   She lives by herself and is extremely independent. She takes care of  all her personal needs, including her medical needs. She is very  compliant. She checks her blood pressure twice daily.  She could not tolerate Lipitor in the past because of muscle aches in her legs.   She experiences chest pain 2-3 times per month. Usually, one sublingual nitroglycerin will relieve the pain. However, when the pain radiates down her left arm, sometimes takes up to 3 sublingual nitroglycerin. She has not called 911 for this.       Allergies  Allergen Reactions  . Percocet [Oxycodone-Acetaminophen]   . Prednisone     Current Outpatient Prescriptions  Medication Sig Dispense Refill  . acetaminophen (TYLENOL) 650 MG CR tablet Take 650 mg by mouth as needed.        Marland Kitchen. albuterol (PROVENTIL) (2.5 MG/3ML) 0.083% nebulizer solution Take 2.5 mg by nebulization 4 (four) times daily.       Marland Kitchen. alprazolam (XANAX) 2 MG tablet Take 2 mg by mouth as directed.        Marland Kitchen. aspirin EC 81 MG tablet Take 81 mg by mouth daily.      . COD LIVER OIL W/VIT A & D PO Take 1 tablet by mouth daily.      . fluticasone (FLONASE) 50 MCG/ACT nasal spray Place 1 spray into the nose daily.      . insulin lispro (HUMALOG) 100 UNIT/ML injection Inject into the skin as directed.        . insulin NPH-insulin regular (NOVOLIN 70/30) (70-30) 100 UNIT/ML injection Inject into the  skin as directed.        Marland Kitchen. ipratropium (ATROVENT) 0.02 % nebulizer solution Take 500 mcg by nebulization 4 (four) times daily.      Marland Kitchen. losartan (COZAAR) 50 MG tablet Take 50 mg by mouth daily.        . Multiple Vitamins-Minerals (PROTEGRA PO) Take 1 tablet by mouth daily.        . nebivolol (BYSTOLIC) 10 MG tablet Take 10 mg by mouth daily.        . pantoprazole (PROTONIX) 40 MG tablet Take 40 mg by mouth daily.        . potassium chloride SA (K-DUR,KLOR-CON) 20 MEQ tablet Take 20 mEq by mouth daily.        Marland Kitchen. torsemide (DEMADEX) 20 MG tablet Take 20 mg by mouth daily.       No current facility-administered medications for this visit.    Past Medical History  Diagnosis Date  . Coronary artery disease   . Hyperlipidemia   . Hypertension   . Mitral regurgitation   . Asthma   . Stroke   . Chronic headaches     Past Surgical History  Procedure Laterality Date  . Back surgery    . Cholecystectomy  1994  . Rotator cuff repair    .  Partial hysterectomy  1973  . Appendectomy  1942    History   Social History  . Marital Status: Widowed    Spouse Name: N/A    Number of Children: N/A  . Years of Education: N/A   Occupational History  . Retired    Social History Main Topics  . Smoking status: Former Games developermoker  . Smokeless tobacco: Not on file     Comment: pt states she only smoked 2 cigs a day x 3months.   . Alcohol Use: No  . Drug Use: No  . Sexual Activity: Not on file   Other Topics Concern  . Not on file   Social History Narrative   Exercises regularly     Filed Vitals:   08/11/13 1458  BP: 118/70  Pulse: 84  Height: 5\' 1"  (1.549 m)  Weight: 165 lb (74.844 kg)  SpO2: 96%    PHYSICAL EXAM General: NAD  Neck: No JVD, no thyromegaly or thyroid nodule.  Lungs: Clear to auscultation bilaterally with normal respiratory effort.  CV: Nondisplaced PMI. Heart regular S1/S2, no S3/S4, II/VI holosystolic murmur at LSB. Trace peripheral edema, wearing compression  stockings.. No carotid bruit. Normal pedal pulses.  Abdomen: Soft, nontender, no hepatosplenomegaly, no distention.  Skin: Intact without lesions or rashes.  Neurologic: Alert and oriented x 3.  Psych: Normal affect.  Extremities: No clubbing or cyanosis.  HEENT: Normal.    ECG: reviewed and available in electronic records.      ASSESSMENT AND PLAN: 1. CAD: I will continue current medical therapy, which includes ASA and Bystolic. I have encouraged her to call 911 should she have to use 3 sublingual nitroglycerin to alleviate chest pain. 2. HTN: Controlled on Bystolic and Cozaar.  3. Hyperlipidemia: Did not tolerate statins in past. On fish oil.  4. Mild aortic root dilatation: Continue to monitor. No diastolic murmur appreciated.  5. Mild mitral regurgitation: Symptomatically stable.  Dispo: f/u 6 months.  Prentice DockerSuresh Julena Barbour, M.D., F.A.C.C.

## 2013-08-11 NOTE — Patient Instructions (Signed)
Continue all current medications. Your physician wants you to follow up in: 6 months.  You will receive a reminder letter in the mail one-two months in advance.  If you don't receive a letter, please call our office to schedule the follow up appointment   

## 2014-05-22 ENCOUNTER — Encounter (HOSPITAL_COMMUNITY): Payer: Self-pay | Admitting: *Deleted

## 2014-05-22 ENCOUNTER — Emergency Department (HOSPITAL_COMMUNITY): Payer: Medicare Other

## 2014-05-22 ENCOUNTER — Emergency Department (HOSPITAL_COMMUNITY)
Admission: EM | Admit: 2014-05-22 | Discharge: 2014-05-22 | Disposition: A | Discharge status: Home / Self Care | Payer: Medicare Other | Attending: Emergency Medicine | Admitting: Emergency Medicine

## 2014-05-22 DIAGNOSIS — J45909 Unspecified asthma, uncomplicated: Secondary | ICD-10-CM | POA: Diagnosis not present

## 2014-05-22 DIAGNOSIS — Z7982 Long term (current) use of aspirin: Secondary | ICD-10-CM | POA: Diagnosis not present

## 2014-05-22 DIAGNOSIS — Y998 Other external cause status: Secondary | ICD-10-CM | POA: Diagnosis not present

## 2014-05-22 DIAGNOSIS — I2510 Atherosclerotic heart disease of native coronary artery without angina pectoris: Secondary | ICD-10-CM | POA: Insufficient documentation

## 2014-05-22 DIAGNOSIS — Z7951 Long term (current) use of inhaled steroids: Secondary | ICD-10-CM | POA: Diagnosis not present

## 2014-05-22 DIAGNOSIS — G8929 Other chronic pain: Secondary | ICD-10-CM | POA: Insufficient documentation

## 2014-05-22 DIAGNOSIS — Y9389 Activity, other specified: Secondary | ICD-10-CM | POA: Insufficient documentation

## 2014-05-22 DIAGNOSIS — Z79899 Other long term (current) drug therapy: Secondary | ICD-10-CM | POA: Insufficient documentation

## 2014-05-22 DIAGNOSIS — S0990XA Unspecified injury of head, initial encounter: Secondary | ICD-10-CM | POA: Diagnosis present

## 2014-05-22 DIAGNOSIS — S0101XA Laceration without foreign body of scalp, initial encounter: Secondary | ICD-10-CM | POA: Diagnosis not present

## 2014-05-22 DIAGNOSIS — I340 Nonrheumatic mitral (valve) insufficiency: Secondary | ICD-10-CM | POA: Diagnosis not present

## 2014-05-22 DIAGNOSIS — Z87891 Personal history of nicotine dependence: Secondary | ICD-10-CM | POA: Insufficient documentation

## 2014-05-22 DIAGNOSIS — Z8673 Personal history of transient ischemic attack (TIA), and cerebral infarction without residual deficits: Secondary | ICD-10-CM | POA: Insufficient documentation

## 2014-05-22 DIAGNOSIS — Z794 Long term (current) use of insulin: Secondary | ICD-10-CM | POA: Diagnosis not present

## 2014-05-22 DIAGNOSIS — W11XXXA Fall on and from ladder, initial encounter: Secondary | ICD-10-CM | POA: Insufficient documentation

## 2014-05-22 DIAGNOSIS — W19XXXA Unspecified fall, initial encounter: Secondary | ICD-10-CM

## 2014-05-22 DIAGNOSIS — I10 Essential (primary) hypertension: Secondary | ICD-10-CM | POA: Insufficient documentation

## 2014-05-22 DIAGNOSIS — Y9289 Other specified places as the place of occurrence of the external cause: Secondary | ICD-10-CM | POA: Insufficient documentation

## 2014-05-22 MED ORDER — ACETAMINOPHEN 325 MG PO TABS
650.0000 mg | ORAL_TABLET | Freq: Once | ORAL | Status: AC
  Administered 2014-05-22: 650 mg via ORAL
  Filled 2014-05-22: qty 2

## 2014-05-22 NOTE — ED Notes (Signed)
Pt fell backwards from a 472ft step stool, hitting her head on a coffee table. Laceration to scalp. Bleeding controlled. C-Collar in place by EMS. Pt denies neck pain. Pt is not on anticoagulant, per EMS

## 2014-05-22 NOTE — Discharge Instructions (Signed)
°Emergency Department Resource Guide °1) Find a Doctor and Pay Out of Pocket °Although you won't have to find out who is covered by your insurance plan, it is a good idea to ask around and get recommendations. You will then need to call the office and see if the doctor you have chosen will accept you as a new patient and what types of options they offer for patients who are self-pay. Some doctors offer discounts or will set up payment plans for their patients who do not have insurance, but you will need to ask so you aren't surprised when you get to your appointment. ° °2) Contact Your Local Health Department °Not all health departments have doctors that can see patients for sick visits, but many do, so it is worth a call to see if yours does. If you don't know where your local health department is, you can check in your phone book. The CDC also has a tool to help you locate your state's health department, and many state websites also have listings of all of their local health departments. ° °3) Find a Walk-in Clinic °If your illness is not likely to be very severe or complicated, you may want to try a walk in clinic. These are popping up all over the country in pharmacies, drugstores, and shopping centers. They're usually staffed by nurse practitioners or physician assistants that have been trained to treat common illnesses and complaints. They're usually fairly quick and inexpensive. However, if you have serious medical issues or chronic medical problems, these are probably not your best option. ° °No Primary Care Doctor: °- Call Health Connect at  832-8000 - they can help you locate a primary care doctor that  accepts your insurance, provides certain services, etc. °- Physician Referral Service- 1-800-533-3463 ° °Chronic Pain Problems: °Organization         Address  Phone   Notes  °[facility]  (336) 297-2271 Patients need to be referred by their primary care doctor.  ° °Medication  Assistance: °Organization         Address  Phone   Notes  °[place] [facility] [address] °[place], [place] [address] (336) 641-8030 --Must be a resident of [place] °-- Must have NO insurance coverage whatsoever (no Medicaid/ Medicare, etc.) °-- The pt. MUST have a primary care doctor that directs their care regularly and follows them in the community °  °[facility]  (866) 331-1348   °[employer]  (888) 892-1162   ° °Agencies that provide inexpensive medical care: °Organization         Address  Phone   Notes  °[facility]  (336) 832-8035   °[facility]    (336) 832-7272   °[facility] [address] °[place], [place] [address] (336) 832-4777   °[facility] [address], °[place] (336) 271-4999   °[facility]    (336) 373-0678   °[facility]    (336) 272-1050   °[facility] ° [address], [place] Phone:  (336) 832-4444, Fax:  (336) 832-4440 Hours of Operation:  9 am - 6 pm, M-F.  Also accepts Medicaid/Medicare and self-pay.  °[facility] ° [address], [place] Phone: (336) [phone], Fax: (336) [phone]. Hours of Operation:  8:30 am - 5:30 pm, M-F.  Also accepts Medicaid and self-pay.  °[facility] [place] [address], [place] Phone: (336) 878-6027   °[facility] [address], [place], [place] (336)723-1848, Ext. 123 Mondays & Thursdays: 7-9 AM.  First 15 patients are seen on a first come, first serve basis. °  ° °Medicaid-accepting [place] Providers: ° °Organization         Address  Phone   Notes  °[facility] [address], [place] (336) 641-2100 Also accepts self-pay patients.  °[facility] [address], [place] ° (336) 856-9996   °[facility] [address], [place]  (336) 288-8857   °[facility] [address] [place] [address], [place] (336) 299-7000   °Veita Bland [address], [place]  ° (336) 373-1557 Only accepts [place] Access Medicaid patients after they have their name applied to their card.  ° °Self-Pay (no insurance) in [place]: ° °Organization         Address  Phone   Notes  °Sickle Cell Patients, [facility] [address], [place] (336) 832-1970   °[facility] [address], [place] (336) 832-4400   °[facility] ° [address], [place] (336) 992-4800   °[facility]/Dr. Osei-Bonsu ° [address] [place] [address], [place] or [address], [place] (336) 841-8500 Phone number for both [place] and [place] locations is the same.  °[facility] [address], [place] (336) 299-0000   °[facility] [address] [place] [address], [place] or [address] (336) 852-7530 °(336) 878-2260   °[facility] [address], [place] (336) 350-1642, phone; (336) 294-5005, fax Sees patients 1st and 3rd Saturday of every month.  Must not qualify for public or private insurance (i.e. Medicaid, Medicare, [place] Health Choice, Veterans' Benefits) • Household income should be no more than 200% of the poverty level •The clinic cannot treat you if you are pregnant or think you are pregnant • Sexually transmitted diseases are not treated at the clinic.  ° ° °Dental Care: °Organization         Address  Phone  Notes  °[place] [facility] [address], [place] (336) 641-6152 Accepts children up to age 21 who are enrolled in Medicaid or [place] Health Choice; pregnant women with a Medicaid card; and children who have applied for Medicaid or [place] Health Choice, but were declined, whose parents can pay a reduced fee at time of service.  °[place]  [facility] [place]  [address], [place] (336) 641-7733 Accepts children up to age 21 who are enrolled in Medicaid or [place] Health Choice; pregnant women with a Medicaid card; and children who have applied for Medicaid or [place] Health Choice, but were declined, whose parents can pay a reduced fee at time of service.  °[facility] ° [address], [place] (336) 641-4533 Patients are seen by appointment only. Walk-ins are not accepted. [facility] will see patients 18 years of age and older. °Monday - Tuesday (8am-5pm) °Most Wednesdays (8:30-5pm) °$30 per visit, cash only  °[facility] ° [address], [place] (336) 641-4533 Patients are seen by appointment only. Walk-ins are not accepted. [facility] will see patients 18 years of age and older. °One   Wednesday Evening (Monthly: Volunteer Based).  $30 per visit, cash only  °[facility]  (919) 537-3737 for adults; Children under age 4, call Graduate Pediatric Dentistry at ([phone]) [phone]. Children aged 4-14, please call (919) [phone] to request a pediatric application. ° Dental services are provided in all areas of dental care including fillings, crowns and bridges, complete and partial dentures, implants, gum treatment, root canals, and extractions. Preventive care is also provided. Treatment is provided to both adults and children. °Patients are selected via a lottery and there is often a waiting list. °  °[facility] [address], °[place] ° (336) 763-8833 [email or web address] °  °[facility] [address], [place], [place] (336)723-1848, Ext. 123 Second and Fourth Thursday of each month, opens at 6:30 AM; Clinic ends at 9 AM.  Patients are seen on a first-come first-served basis, and a limited number are seen during each clinic.  ° °[facility] ° [address], [place], [place] (336) 723-7904    Eligibility Requirements °You must have lived in [place], [place], or [place] counties for at least the last three months. °  You cannot be eligible for state or federal sponsored healthcare insurance, including Veterans Administration, Medicaid, or Medicare. °  You generally cannot be eligible for healthcare insurance through your employer.  °  How to apply: °Eligibility screenings are held every Tuesday and Wednesday afternoon from 1:00 pm until 4:00 pm. You do not need an appointment for the interview!  °[facility] [address], [place], [place] 336-631-2330   °[facility]  336-342-8273   °[facility]  336-703-3100   °[facility]  336-570-6415   ° °Behavioral Health Resources in the Community: °Intensive Outpatient Programs °Organization         Address  Phone  Notes  °[facility] [address], [place], [place] 336-878-6098   °[facility] [address], [place], [place] 336-832-9800   °[facility] [address], [place], [place] ° 336-882-2125   °[facility] [address],  °[place], [place] 1-800-853-5163 or 336-641-4981   °Substance Abuse Resources °Organization         Address  Phone  Notes  °[facility]  336-882-2125   °[facility]  336-784-9470   °[facility]  336-285-9073   °[facility]  336-845-3988   °Residential & Outpatient Substance Abuse Program  1-800-659-3381   °Psychological Services °Organization         Address  Phone  Notes  °[facility]  336- 832-9600   °[facility]  336- 378-7881   °[facility] [address], [place] 1-800-853-5163 or 336-641-4981   ° °Mobile Crisis Teams °Organization         Address  Phone  Notes  °[facility], Mobile Crisis Care Unit  1-877-626-1772   °Assertive °[facility] ° [address]  [place], [place] 336-834-9664   °Sharon DeEsch [address] °[place] 336-554-5454   ° °Self-Help/Support Groups °Organization         Address  Phone             Notes  °[facility] of  - variety of support groups  336- 373-1402 Call for more information  °Narcotics Anonymous (NA), [facility] [address], °[place]  2 meetings at this location  ° °  Residential Treatment Programs Organization         Address  Phone  Notes  [facility] [address],    [place]  [phone]   [facility]  [address], [place] [phone], [place], [place] 244-010-2725([phone]   [facility] [address], [place] [phone] Admissions: 8am-3pm M-F  [facility] [address]    [place], [place] [phone]   [facility] [address], [place], [place] [phone]   [facility] [address]  [place], [place] [phone]   [facility] Intensive Outpatient [address], [place], [place] [phone]   [facility] [address]  [place], [place] [phone] or [phone]   [facility]) [address] [place], [place] [phone] Accepts Medicaid  [facility] [address]  [place] [phone]-([phone]) [phone] Substance Abuse/Addiction Treatment   [facility] Organization         Address  Phone  Notes  [facility]  618-076-4178(888) 224-069-5044   Angie FavaJulie Brannon, PhD [address], [address] [place], [place]   534-718-2332(336) 708-065-5853 or [phone](336) ([phone]   [facility]   [address] [place], [place] [phone](336) (219)[phone]   [facility] [address], [place], [place] (206) [phone](336) [phone] Insurance/Medicaid/sponsorship through [facility]  [facility] [address]                                    [place], [place] [phone](336) [phone] Therapy/tele-psych/case    [facility] [address]   [place], [place] [phone](336) [phone]    Dr. Lolly MustacheArfeen  [phone](336) [phone]   [facility]  [employer] [facility] 1) [address], [place] 2) [address], [place] 3)  [address], [place] [phone](336) [phone](336) [phone]  [phone](336) [phone]   [facility] Child Abuse Hotline 903-574-5126(336) [phone] or 2194533030(336) [phone] (After Hours)      Take over the counter tylenol, as directed on packaging, as needed for discomfort.  Wash the area with soap and water at least twice a day.  Washing the area with soap and water if the area becomes soiled or wet.  Call your regular medical doctor tomorrow to schedule a follow up appointment for staple removal in the next 7 to 10 days.  Return to the Emergency Department immediately if worsening.

## 2014-05-22 NOTE — ED Provider Notes (Signed)
CSN: [id number]     Arrival date & time 05/22/14  1357 History   First MD Initiated Contact with Patient 05/22/14 1431     Chief Complaint  Patient presents with  . Head Injury      HPI Pt was seen at 1545. Per pt and her son, c/o sudden onset and resolution of one episode of slip and fall that occurred PTA. Pt states she was climbing down from a 2 step ladder, and was on the 1st step when her "foot slipped" and she fell backwards. Pt hit the back of her head against a coffee table, lacerating her scalp. Denies neck or back pain, no LOC, no AMS, no prodromal symptoms before fall. Denies any other complaints.    Past Medical History  Diagnosis Date  . Coronary artery disease   . Hyperlipidemia   . Hypertension   . Mitral regurgitation   . Asthma   . Stroke   . Chronic headaches    Past Surgical History  Procedure Laterality Date  . Back surgery    . Cholecystectomy  1994  . Rotator cuff repair    . Partial hysterectomy  1973  . Appendectomy  1942   Family History  Problem Relation Age of Onset  . Coronary artery disease Mother   . Diabetes Father   . COPD Father    History  Substance Use Topics  . Smoking status: Former Games developer  . Smokeless tobacco: Not on file     Comment: pt states she only smoked 2 cigs a day x 3months.   . Alcohol Use: No    Review of Systems ROS: Statement: All systems negative except as marked or noted in the HPI; Constitutional: Negative for fever and chills. ; ; Eyes: Negative for eye pain, redness and discharge. ; ; ENMT: Negative for ear pain, hoarseness, nasal congestion, sinus pressure and sore throat. ; ; Cardiovascular: Negative for chest pain, palpitations, diaphoresis, dyspnea and peripheral edema. ; ; Respiratory: Negative for cough, wheezing and stridor. ; ; Gastrointestinal: Negative for nausea, vomiting, diarrhea, abdominal pain, blood in stool, hematemesis, jaundice and rectal bleeding. . ; ; Genitourinary: Negative for dysuria, flank  pain and hematuria. ; ; Musculoskeletal: Negative for back pain and neck pain. Negative for swelling and deformity.; ; Skin: +scalp laceration. Negative for pruritus, rash, abrasions, blisters, bruising and skin lesion.; ; Neuro: Negative for headache, lightheadedness and neck stiffness. Negative for weakness, altered level of consciousness , altered mental status, extremity weakness, paresthesias, involuntary movement, seizure and syncope.      Allergies  Morphine and related; Percocet; and Prednisone  Home Medications   Prior to Admission medications   Medication Sig Start Date End Date Taking? Authorizing Provider  acetaminophen (TYLENOL) 650 MG CR tablet Take 650 mg by mouth as needed.      Historical Provider, MD  albuterol (PROVENTIL) (2.5 MG/3ML) 0.083% nebulizer solution Take 2.5 mg by nebulization 4 (four) times daily.     Historical Provider, MD  alprazolam Prudy Feeler) 2 MG tablet Take 2 mg by mouth as directed.      Historical Provider, MD  aspirin EC 81 MG tablet Take 81 mg by mouth daily.    Historical Provider, MD  COD LIVER OIL W/VIT A & D PO Take 1 tablet by mouth daily.    Historical Provider, MD  fluticasone (FLONASE) 50 MCG/ACT nasal spray Place 1 spray into the nose daily.    Historical Provider, MD  insulin lispro (HUMALOG) 100 UNIT/ML injection Inject  into the skin as directed.      Historical Provider, MD  insulin NPH-insulin regular (NOVOLIN 70/30) (70-30) 100 UNIT/ML injection Inject into the skin as directed.      Historical Provider, MD  ipratropium (ATROVENT) 0.02 % nebulizer solution Take 500 mcg by nebulization 4 (four) times daily.    Historical Provider, MD  losartan (COZAAR) 50 MG tablet Take 50 mg by mouth daily.      Historical Provider, MD  Multiple Vitamins-Minerals (PROTEGRA PO) Take 1 tablet by mouth daily.      Historical Provider, MD  nebivolol (BYSTOLIC) 10 MG tablet Take 10 mg by mouth daily.      Historical Provider, MD  pantoprazole (PROTONIX) 40 MG  tablet Take 40 mg by mouth daily.      Historical Provider, MD  potassium chloride SA (K-DUR,KLOR-CON) 20 MEQ tablet Take 20 mEq by mouth daily.      Historical Provider, MD  torsemide (DEMADEX) 20 MG tablet Take 20 mg by mouth daily.    Historical Provider, MD   BP 138/40 mmHg  Pulse 72  Temp(Src) 98 F (36.7 C) (Oral)  Resp 16  Ht 5\' 1"  (1.549 m)  Wt 160 lb (72.576 kg)  BMI 30.25 kg/m2  SpO2 96% Physical Exam  1550; Physical examination: Vital signs and O2 SAT: Reviewed; Constitutional: Well developed, Well nourished, Well hydrated, In no acute distress; Head and Face: Normocephalic, +2.5cm linear lac to right posterior scalp.; Eyes: EOMI, PERRL, No scleral icterus; ENMT: Mouth and pharynx normal, Left TM normal, Right TM normal, Mucous membranes moist; Neck: Immobilized in C-collar, Trachea midline; Spine: No midline CS, TS, LS tenderness.; Cardiovascular: Regular rate and rhythm, No gallop; Respiratory: Breath sounds clear & equal bilaterally, No rales, rhonchi, wheezes, Normal respiratory effort/excursion; Chest: Nontender, No deformity, Movement normal, No crepitus, No abrasions or ecchymosis.; Abdomen: Soft, Nontender, Nondistended, Normal bowel sounds, No abrasions or ecchymosis.; Genitourinary: No CVA tenderness.; Extremities: No deformity, Full range of motion major/large joints of bilat UE's and LE's without pain or tenderness to palp, Neurovascularly intact, Pulses normal, No tenderness, No edema, Pelvis stable; Neuro: AA&Ox3, GCS 15.  Major CN grossly intact. Speech clear. No gross focal motor or sensory deficits in extremities.; Skin: Color normal, Warm, Dry   ED Course  Procedures   LACERATION REPAIR Performed by: Laray AngerMCMANUS,Chen Holzman M Authorized by: Laray AngerMCMANUS,Jakye Mullens M Consent: Verbal consent obtained. Risks and benefits: risks, benefits and alternatives were discussed Consent given by: patient Patient identity confirmed: provided demographic data Prepped and Draped in normal  sterile fashion Wound explored Laceration Location: right posterior scalp Laceration Length: 2.5cm No Foreign Bodies seen or palpated Anesthesia: none Irrigation method: syringe Amount of cleaning: standard Skin closure: staples Number of sutures: 5 Patient tolerance: Patient tolerated the procedure well with no immediate complications.     EKG Interpretation None      MDM  MDM Reviewed: previous chart, nursing note and vitals Interpretation: CT scan   Ct Head Wo Contrast 05/22/2014   CLINICAL DATA:  Trama. Patient fell off a 2 step stepstool hitting the back of her head on the coffee table. Laceration to scalp. No LOC. Pt is NOT on anticoagulants. Hx of stroke.  EXAM: CT HEAD WITHOUT CONTRAST  CT CERVICAL SPINE WITHOUT CONTRAST  TECHNIQUE: Multidetector CT imaging of the head and cervical spine was performed following the standard protocol without intravenous contrast. Multiplanar CT image reconstructions of the cervical spine were also generated.  COMPARISON:  04/06/2004  FINDINGS: CT HEAD FINDINGS  Ventricles  are normal configuration. There is ventricular and sulcal enlargement reflecting age appropriate volume loss. There are no parenchymal masses or mass effect. There is no evidence of an infarct. Mild, patchy white matter hypoattenuation is noted consistent with chronic microvascular ischemic change. There are no extra-axial masses or abnormal fluid collections. There is no intracranial hemorrhage. There is minor ethmoid, right maxillary and sphenoid sinus mucosal thickening. Mastoid air cells are clear. Small area of left parietal scalp contusion and laceration. No underlying fracture.  CT CERVICAL SPINE FINDINGS  No fracture. Degenerative grade 1 anterolisthesis of C4-C5. No other spondylolisthesis. Mild loss disc height at C3-C4. Moderate to marked loss of disc height at C5-C6 and C6-C7. There spondylitis bulging and uncovertebral spurring multiple levels. Facet degenerative changes  noted bilaterally at multiple levels. There are varying degrees of neural foraminal narrowing which is most evident on the left at C4-C5 where it is moderate to severe.  Soft tissues show carotid vascular calcifications. No masses or enlarged lymph nodes are seen. Lung apices are clear.  IMPRESSION: HEAD CT:  No acute intracranial abnormality.  No skull fracture.  CERVICAL CT:  No fracture or acute finding.   Electronically Signed   By: Amie Portland M.D.   On: 05/22/2014 15:26   Ct Cervical Spine Wo Contrast 05/22/2014   CLINICAL DATA:  Trama. Patient fell off a 2 step stepstool hitting the back of her head on the coffee table. Laceration to scalp. No LOC. Pt is NOT on anticoagulants. Hx of stroke.  EXAM: CT HEAD WITHOUT CONTRAST  CT CERVICAL SPINE WITHOUT CONTRAST  TECHNIQUE: Multidetector CT imaging of the head and cervical spine was performed following the standard protocol without intravenous contrast. Multiplanar CT image reconstructions of the cervical spine were also generated.  COMPARISON:  04/06/2004  FINDINGS: CT HEAD FINDINGS  Ventricles are normal configuration. There is ventricular and sulcal enlargement reflecting age appropriate volume loss. There are no parenchymal masses or mass effect. There is no evidence of an infarct. Mild, patchy white matter hypoattenuation is noted consistent with chronic microvascular ischemic change. There are no extra-axial masses or abnormal fluid collections. There is no intracranial hemorrhage. There is minor ethmoid, right maxillary and sphenoid sinus mucosal thickening. Mastoid air cells are clear. Small area of left parietal scalp contusion and laceration. No underlying fracture.  CT CERVICAL SPINE FINDINGS  No fracture. Degenerative grade 1 anterolisthesis of C4-C5. No other spondylolisthesis. Mild loss disc height at C3-C4. Moderate to marked loss of disc height at C5-C6 and C6-C7. There spondylitis bulging and uncovertebral spurring multiple levels. Facet  degenerative changes noted bilaterally at multiple levels. There are varying degrees of neural foraminal narrowing which is most evident on the left at C4-C5 where it is moderate to severe.  Soft tissues show carotid vascular calcifications. No masses or enlarged lymph nodes are seen. Lung apices are clear.  IMPRESSION: HEAD CT:  No acute intracranial abnormality.  No skull fracture.  CERVICAL CT:  No fracture or acute finding.   Electronically Signed   By: Amie Portland M.D.   On: 05/22/2014 15:26     1710:  Lac closed with staples. Pt want to go home now and family would like to take her home. Dx and testing d/w pt and family.  Questions answered.  Verb understanding, agreeable to d/c home with outpt f/u.    Samuel Jester, DO 05/24/14 1651

## 2018-05-08 ENCOUNTER — Ambulatory Visit (HOSPITAL_COMMUNITY)
Admission: RE | Admit: 2018-05-08 | Discharge: 2018-05-08 | Disposition: A | Discharge status: Home / Self Care | Payer: Medicare Other | Source: Ambulatory Visit | Attending: Internal Medicine | Admitting: Internal Medicine

## 2018-05-08 ENCOUNTER — Other Ambulatory Visit (HOSPITAL_COMMUNITY): Payer: Self-pay | Admitting: Internal Medicine

## 2018-05-08 ENCOUNTER — Encounter (HOSPITAL_COMMUNITY): Payer: Self-pay

## 2018-05-08 DIAGNOSIS — R05 Cough: Secondary | ICD-10-CM

## 2018-05-08 DIAGNOSIS — R059 Cough, unspecified: Secondary | ICD-10-CM

## 2020-05-28 ENCOUNTER — Encounter (HOSPITAL_COMMUNITY): Payer: Self-pay

## 2020-05-28 ENCOUNTER — Observation Stay (HOSPITAL_COMMUNITY)
Admission: EM | Admit: 2020-05-28 | Discharge: 2020-05-30 | Disposition: A | Discharge status: Home / Self Care | Payer: Medicare Other | Attending: Internal Medicine | Admitting: Internal Medicine

## 2020-05-28 ENCOUNTER — Emergency Department (HOSPITAL_COMMUNITY): Payer: Medicare Other

## 2020-05-28 ENCOUNTER — Other Ambulatory Visit: Payer: Self-pay

## 2020-05-28 DIAGNOSIS — R4701 Aphasia: Secondary | ICD-10-CM | POA: Diagnosis not present

## 2020-05-28 DIAGNOSIS — Z7982 Long term (current) use of aspirin: Secondary | ICD-10-CM | POA: Diagnosis not present

## 2020-05-28 DIAGNOSIS — G9340 Encephalopathy, unspecified: Secondary | ICD-10-CM

## 2020-05-28 DIAGNOSIS — Z20822 Contact with and (suspected) exposure to covid-19: Secondary | ICD-10-CM | POA: Insufficient documentation

## 2020-05-28 DIAGNOSIS — I10 Essential (primary) hypertension: Secondary | ICD-10-CM | POA: Diagnosis not present

## 2020-05-28 DIAGNOSIS — Z87891 Personal history of nicotine dependence: Secondary | ICD-10-CM | POA: Insufficient documentation

## 2020-05-28 DIAGNOSIS — Z794 Long term (current) use of insulin: Secondary | ICD-10-CM | POA: Diagnosis not present

## 2020-05-28 DIAGNOSIS — J45909 Unspecified asthma, uncomplicated: Secondary | ICD-10-CM | POA: Insufficient documentation

## 2020-05-28 DIAGNOSIS — G459 Transient cerebral ischemic attack, unspecified: Secondary | ICD-10-CM | POA: Diagnosis not present

## 2020-05-28 DIAGNOSIS — E785 Hyperlipidemia, unspecified: Secondary | ICD-10-CM | POA: Diagnosis not present

## 2020-05-28 DIAGNOSIS — E871 Hypo-osmolality and hyponatremia: Secondary | ICD-10-CM | POA: Insufficient documentation

## 2020-05-28 DIAGNOSIS — Z79899 Other long term (current) drug therapy: Secondary | ICD-10-CM | POA: Insufficient documentation

## 2020-05-28 DIAGNOSIS — R471 Dysarthria and anarthria: Secondary | ICD-10-CM | POA: Diagnosis not present

## 2020-05-28 DIAGNOSIS — R4182 Altered mental status, unspecified: Principal | ICD-10-CM

## 2020-05-28 DIAGNOSIS — E119 Type 2 diabetes mellitus without complications: Secondary | ICD-10-CM | POA: Insufficient documentation

## 2020-05-28 LAB — I-STAT CHEM 8, ED
BUN: 10 mg/dL (ref 8–23)
Calcium, Ion: 1.08 mmol/L — ABNORMAL LOW (ref 1.15–1.40)
Chloride: 95 mmol/L — ABNORMAL LOW (ref 98–111)
Creatinine, Ser: 0.7 mg/dL (ref 0.44–1.00)
Glucose, Bld: 190 mg/dL — ABNORMAL HIGH (ref 70–99)
HCT: 43 % (ref 36.0–46.0)
Hemoglobin: 14.6 g/dL (ref 12.0–15.0)
Potassium: 4.9 mmol/L (ref 3.5–5.1)
Sodium: 132 mmol/L — ABNORMAL LOW (ref 135–145)
TCO2: 30 mmol/L (ref 22–32)

## 2020-05-28 LAB — GLUCOSE, CAPILLARY
Glucose-Capillary: 90 mg/dL (ref 70–99)
Glucose-Capillary: 93 mg/dL (ref 70–99)

## 2020-05-28 LAB — URINALYSIS, ROUTINE W REFLEX MICROSCOPIC
Bilirubin Urine: NEGATIVE
Glucose, UA: NEGATIVE mg/dL
Hgb urine dipstick: NEGATIVE
Ketones, ur: NEGATIVE mg/dL
Nitrite: NEGATIVE
Protein, ur: NEGATIVE mg/dL
Specific Gravity, Urine: 1.008 (ref 1.005–1.030)
pH: 6 (ref 5.0–8.0)

## 2020-05-28 LAB — CBC
HCT: 38.9 % (ref 36.0–46.0)
Hemoglobin: 13.1 g/dL (ref 12.0–15.0)
MCH: 34.9 pg — ABNORMAL HIGH (ref 26.0–34.0)
MCHC: 33.7 g/dL (ref 30.0–36.0)
MCV: 103.7 fL — ABNORMAL HIGH (ref 80.0–100.0)
Platelets: 210 10*3/uL (ref 150–400)
RBC: 3.75 MIL/uL — ABNORMAL LOW (ref 3.87–5.11)
RDW: 13 % (ref 11.5–15.5)
WBC: 7.5 10*3/uL (ref 4.0–10.5)
nRBC: 0 % (ref 0.0–0.2)

## 2020-05-28 LAB — DIFFERENTIAL
Abs Immature Granulocytes: 0.02 10*3/uL (ref 0.00–0.07)
Basophils Absolute: 0.1 10*3/uL (ref 0.0–0.1)
Basophils Relative: 1 %
Eosinophils Absolute: 0.6 10*3/uL — ABNORMAL HIGH (ref 0.0–0.5)
Eosinophils Relative: 8 %
Immature Granulocytes: 0 %
Lymphocytes Relative: 38 %
Lymphs Abs: 2.9 10*3/uL (ref 0.7–4.0)
Monocytes Absolute: 0.8 10*3/uL (ref 0.1–1.0)
Monocytes Relative: 10 %
Neutro Abs: 3.1 10*3/uL (ref 1.7–7.7)
Neutrophils Relative %: 43 %

## 2020-05-28 LAB — COMPREHENSIVE METABOLIC PANEL
ALT: 15 U/L (ref 0–44)
AST: 20 U/L (ref 15–41)
Albumin: 3.9 g/dL (ref 3.5–5.0)
Alkaline Phosphatase: 41 U/L (ref 38–126)
Anion gap: 8 (ref 5–15)
BUN: 9 mg/dL (ref 8–23)
CO2: 26 mmol/L (ref 22–32)
Calcium: 9 mg/dL (ref 8.9–10.3)
Chloride: 97 mmol/L — ABNORMAL LOW (ref 98–111)
Creatinine, Ser: 0.67 mg/dL (ref 0.44–1.00)
GFR, Estimated: >60 mL/min (ref >60)
Glucose, Bld: 198 mg/dL — ABNORMAL HIGH (ref 70–99)
Potassium: 4.6 mmol/L (ref 3.5–5.1)
Sodium: 131 mmol/L — ABNORMAL LOW (ref 135–145)
Total Bilirubin: 1.2 mg/dL (ref 0.3–1.2)
Total Protein: 6.7 g/dL (ref 6.5–8.1)

## 2020-05-28 LAB — VITAMIN B12: Vitamin B-12: 376 pg/mL (ref 180–914)

## 2020-05-28 LAB — APTT: aPTT: 31 seconds (ref 24–36)

## 2020-05-28 LAB — RAPID URINE DRUG SCREEN, HOSP PERFORMED
Amphetamines: NOT DETECTED
Barbiturates: NOT DETECTED
Benzodiazepines: POSITIVE — AB
Cocaine: NOT DETECTED
Opiates: NOT DETECTED
Tetrahydrocannabinol: NOT DETECTED

## 2020-05-28 LAB — PROTIME-INR
INR: 1 (ref 0.8–1.2)
Prothrombin Time: 12.7 seconds (ref 11.4–15.2)

## 2020-05-28 LAB — TSH: TSH: 1.403 u[IU]/mL (ref 0.350–4.500)

## 2020-05-28 LAB — CBG MONITORING, ED: Glucose-Capillary: 194 mg/dL — ABNORMAL HIGH (ref 70–99)

## 2020-05-28 LAB — SARS CORONAVIRUS 2 BY RT PCR (HOSPITAL ORDER, PERFORMED IN ~~LOC~~ HOSPITAL LAB): SARS Coronavirus 2: NEGATIVE

## 2020-05-28 LAB — ETHANOL: Alcohol, Ethyl (B): <10 mg/dL (ref <10)

## 2020-05-28 MED ORDER — SENNOSIDES-DOCUSATE SODIUM 8.6-50 MG PO TABS: 1.0000 | ORAL_TABLET | Freq: Every evening | ORAL | Status: DC | PRN

## 2020-05-28 MED ORDER — ACETAMINOPHEN 325 MG PO TABS
650.0000 mg | ORAL_TABLET | ORAL | Status: DC | PRN
  Administered 2020-05-28 – 2020-05-29 (×2): 650 mg via ORAL
  Filled 2020-05-28 (×2): qty 2

## 2020-05-28 MED ORDER — METOPROLOL SUCCINATE ER 25 MG PO TB24
25.0000 mg | ORAL_TABLET | Freq: Every day | ORAL | Status: DC
  Administered 2020-05-28 – 2020-05-29 (×2): 25 mg via ORAL
  Filled 2020-05-28 (×2): qty 1

## 2020-05-28 MED ORDER — ASPIRIN 325 MG PO TABS
325.0000 mg | ORAL_TABLET | Freq: Every day | ORAL | Status: DC
  Administered 2020-05-28: 325 mg via ORAL
  Filled 2020-05-28 (×2): qty 1

## 2020-05-28 MED ORDER — SODIUM CHLORIDE 0.9 % IV SOLN: INTRAVENOUS | Status: AC

## 2020-05-28 MED ORDER — ADULT MULTIVITAMIN W/MINERALS CH
1.0000 | ORAL_TABLET | Freq: Every day | ORAL | Status: DC
  Administered 2020-05-28 – 2020-05-29 (×2): 1 via ORAL
  Filled 2020-05-28 (×2): qty 1

## 2020-05-28 MED ORDER — INSULIN DETEMIR 100 UNIT/ML ~~LOC~~ SOLN
10.0000 [IU] | Freq: Every day | SUBCUTANEOUS | Status: DC
  Filled 2020-05-28 (×3): qty 0.1

## 2020-05-28 MED ORDER — INSULIN ASPART 100 UNIT/ML ~~LOC~~ SOLN
0.0000 [IU] | Freq: Three times a day (TID) | SUBCUTANEOUS | Status: DC
  Administered 2020-05-29: 1 [IU] via SUBCUTANEOUS
  Administered 2020-05-29 (×2): 2 [IU] via SUBCUTANEOUS

## 2020-05-28 MED ORDER — PROTEGRA PO CAPS: ORAL_CAPSULE | Freq: Every day | ORAL | Status: DC

## 2020-05-28 MED ORDER — INSULIN ASPART 100 UNIT/ML ~~LOC~~ SOLN: 0.0000 [IU] | Freq: Every day | SUBCUTANEOUS | Status: DC

## 2020-05-28 MED ORDER — PANTOPRAZOLE SODIUM 40 MG PO TBEC
40.0000 mg | DELAYED_RELEASE_TABLET | Freq: Every day | ORAL | Status: DC
  Administered 2020-05-28: 40 mg via ORAL
  Filled 2020-05-28 (×2): qty 1

## 2020-05-28 MED ORDER — STROKE: EARLY STAGES OF RECOVERY BOOK
Freq: Once | Status: AC
  Filled 2020-05-28: qty 1

## 2020-05-28 MED ORDER — ACETAMINOPHEN 650 MG RE SUPP: 650.0000 mg | RECTAL | Status: DC | PRN

## 2020-05-28 MED ORDER — ASPIRIN 300 MG RE SUPP: 300.0000 mg | Freq: Every day | RECTAL | Status: DC

## 2020-05-28 MED ORDER — LOSARTAN POTASSIUM 25 MG PO TABS
100.0000 mg | ORAL_TABLET | Freq: Every day | ORAL | Status: DC
  Administered 2020-05-28 – 2020-05-29 (×2): 100 mg via ORAL
  Filled 2020-05-28 (×2): qty 4

## 2020-05-28 MED ORDER — HEPARIN SODIUM (PORCINE) 5000 UNIT/ML IJ SOLN
5000.0000 [IU] | Freq: Three times a day (TID) | INTRAMUSCULAR | Status: DC
  Administered 2020-05-28 – 2020-05-30 (×6): 5000 [IU] via SUBCUTANEOUS
  Filled 2020-05-28 (×4): qty 1

## 2020-05-28 MED ORDER — IPRATROPIUM-ALBUTEROL 0.5-2.5 (3) MG/3ML IN SOLN: 3.0000 mL | Freq: Four times a day (QID) | RESPIRATORY_TRACT | Status: DC | PRN

## 2020-05-28 MED ORDER — FLUTICASONE PROPIONATE 50 MCG/ACT NA SUSP
1.0000 | Freq: Every day | NASAL | Status: DC
  Administered 2020-05-28 – 2020-05-29 (×2): 1 via NASAL
  Filled 2020-05-28: qty 16

## 2020-05-28 MED ORDER — ACETAMINOPHEN 160 MG/5ML PO SOLN: 650.0000 mg | ORAL | Status: DC | PRN

## 2020-05-28 MED ORDER — ALPRAZOLAM 0.5 MG PO TABS
0.5000 mg | ORAL_TABLET | Freq: Three times a day (TID) | ORAL | Status: DC | PRN
  Administered 2020-05-28 – 2020-05-29 (×4): 0.5 mg via ORAL
  Filled 2020-05-28 (×4): qty 1

## 2020-05-28 NOTE — Progress Notes (Signed)
Code Stroke Time Documentation   1100 Call Time  Beeper Time 1115 Exam Started  1119 Exam Finished 1119 Images sent to [facility] 1119 Exam completed in Epic  1120 [facility] radiology called

## 2020-05-28 NOTE — ED Notes (Signed)
Pt pulled out purewick. Pt urinated in bed. Bed linens changed, brief in place and new purewick in place. Encouraged pt to urinate again when she feels ready for urine sample. Pt lying in bed at  This time in no distress. Bed is lowered and locked with side rails up x2. Call bell in reach. Will continue to monitor patient.

## 2020-05-28 NOTE — ED Provider Notes (Signed)
[facility] EMERGENCY DEPARTMENT Provider Note   CSN: [id number] Arrival date & time: 05/28/20  1109  An emergency department physician performed an initial assessment on this suspected stroke patient at 1110.  History Chief Complaint  Patient presents with  . Code Stroke    [name] is a 87 y.o. female.  Patient brought in by EMS.  Initially some confusing story.  But it sounds as if last normal at about 830 yesterday morning when her son spoke with her.  Her son was on the phone with her again at 630 this morning and she seemed to have some slurred speech or speech problems.  And then at 1030 seem not to be able to speak at all.  Her son has Covid so he has not been going in the house to see her.  Her past medical history is significant for chronic headaches coronary disease hyperlipidemia hypertension mitral regurg and a history of TIAs.  No history of seizures.  All information provided by EMS or the son.  Based on her presentation code stroke was initiated even though if onset was 830 yesterday she was out of the window for all interventions.  Patient taken immediately to head CT.        Past Medical History:  Diagnosis Date  . Asthma   . Chronic headaches   . Coronary artery disease   . Hyperlipidemia   . Hypertension   . Mitral regurgitation   . Stroke [facility])     Patient Active Problem List   Diagnosis Date Noted  . Cough 01/24/2012  . DYSPNEA 07/20/2009  . HYPERLIPIDEMIA 07/18/2009  . MITRAL REGURGITATION, 0 (MILD) 07/18/2009  . HYPERTENSION 07/18/2009  . CAD, NATIVE VESSEL 07/18/2009    Past Surgical History:  Procedure Laterality Date  . APPENDECTOMY  1942  . BACK SURGERY    . CHOLECYSTECTOMY  1994  . PARTIAL HYSTERECTOMY  1973  . ROTATOR CUFF REPAIR       OB History   No obstetric history on file.     Family History  Problem Relation Age of Onset  . Coronary artery disease Mother   . Diabetes Father   . COPD Father     Social History    Tobacco Use  . Smoking status: Former Games developer  . Smokeless tobacco: Never Used  . Tobacco comment: pt states she only smoked 2 cigs a day x 20months.   Substance Use Topics  . Alcohol use: No  . Drug use: No    Home Medications Prior to Admission medications   Medication Sig Start Date End Date Taking? Authorizing Provider  acetaminophen (TYLENOL) 650 MG CR tablet Take 650 mg by mouth as needed.      [provider]  albuterol (PROVENTIL) (2.5 MG/3ML) 0.083% nebulizer solution Take 2.5 mg by nebulization 4 (four) times daily.     [provider]  alprazolam Prudy Feeler) 2 MG tablet Take 2 mg by mouth as directed.      [provider]  aspirin EC 81 MG tablet Take 81 mg by mouth daily.    [provider]  COD LIVER OIL W/VIT A & D PO Take 1 tablet by mouth daily.    [provider]  fluticasone (FLONASE) 50 MCG/ACT nasal spray Place 1 spray into the nose daily.    [provider]  insulin lispro (HUMALOG) 100 UNIT/ML injection Inject into the skin as directed.      [provider]  insulin NPH-insulin regular (  NOVOLIN 70/30) (70-30) 100 UNIT/ML injection Inject into the skin as directed.      [provider]  ipratropium (ATROVENT) 0.02 % nebulizer solution Take 500 mcg by nebulization 4 (four) times daily.    [provider]  losartan (COZAAR) 50 MG tablet Take 50 mg by mouth daily.      [provider]  Multiple Vitamins-Minerals (PROTEGRA PO) Take 1 tablet by mouth daily.      [provider]  nebivolol (BYSTOLIC) 10 MG tablet Take 10 mg by mouth daily.      [provider]  pantoprazole (PROTONIX) 40 MG tablet Take 40 mg by mouth daily.      [provider]  potassium chloride SA (K-DUR,KLOR-CON) 20 MEQ tablet Take 20 mEq by mouth daily.      [provider]  torsemide (DEMADEX) 20 MG tablet Take 20 mg by mouth daily.    [provider]    Allergies     Morphine and related, Percocet [oxycodone-acetaminophen], and Prednisone  Review of Systems   Review of Systems  Unable to perform ROS: Patient nonverbal    Physical Exam Updated Vital Signs BP (!) 130/59   Pulse 75   Temp 97.9 F (36.6 C) (Oral)   Resp 19   Wt 69.2 kg   SpO2 98%   BMI 28.83 kg/m   Physical Exam Vitals and nursing note reviewed.  Constitutional:      General: She is in acute distress.     Appearance: She is well-developed and well-nourished.  HENT:     Head: Normocephalic and atraumatic.  Eyes:     Extraocular Movements: Extraocular movements intact.     Conjunctiva/sclera: Conjunctivae normal.     Pupils: Pupils are equal, round, and reactive to light.  Cardiovascular:     Rate and Rhythm: Normal rate and regular rhythm.     Heart sounds: No murmur heard.   Pulmonary:     Effort: Pulmonary effort is normal. No respiratory distress.     Breath sounds: Normal breath sounds.  Abdominal:     Palpations: Abdomen is soft.     Tenderness: There is no abdominal tenderness.  Musculoskeletal:        General: No edema. Normal range of motion.     Cervical back: Normal range of motion and neck supple.  Skin:    General: Skin is warm and dry.     Capillary Refill: Capillary refill takes less than 2 seconds.  Neurological:     Comments: Patient's upper extremities lower extremities completely flaccid.  Patient doing some drooling but no obvious facial weakness patient doing some mumbling.  Not seem to understand commands or instructions.  Psychiatric:        Mood and Affect: Mood and affect normal.     ED Results / Procedures / Treatments   Labs (all labs ordered are listed, but only abnormal results are displayed) Labs Reviewed  CBC - Abnormal; Notable for the following components:      Result Value   RBC 3.75 (*)    MCV 103.7 (*)    MCH 34.9 (*)    All other components within normal limits  DIFFERENTIAL - Abnormal; Notable for the following  components:   Eosinophils Absolute 0.6 (*)    All other components within normal limits  COMPREHENSIVE METABOLIC PANEL - Abnormal; Notable for the following components:   Sodium 131 (*)    Chloride 97 (*)    Glucose, Bld 198 (*)  All other components within normal limits  CBG MONITORING, ED - Abnormal; Notable for the following components:   Glucose-Capillary 194 (*)    All other components within normal limits  I-STAT CHEM 8, ED - Abnormal; Notable for the following components:   Sodium 132 (*)    Chloride 95 (*)    Glucose, Bld 190 (*)    Calcium, Ion 1.08 (*)    All other components within normal limits  SARS CORONAVIRUS 2 BY RT PCR (HOSPITAL ORDER, PERFORMED IN [facility])  ETHANOL  PROTIME-INR  APTT  RAPID URINE DRUG SCREEN, HOSP PERFORMED  URINALYSIS, ROUTINE W REFLEX MICROSCOPIC  POC SARS CORONAVIRUS 2 AG -  ED    EKG EKG Interpretation  Date/Time:  Sunday May 28 2020 11:41:47 EST Ventricular Rate:  71 PR Interval:    QRS Duration: 85 QT Interval:  396 QTC Calculation: 431 R Axis:   -38 Text Interpretation: Accelerated junctional rhythm Inferior infarct, old Anteroseptal infarct, age indeterminate Baseline wander in lead(s) II V1 V2 Confirmed by Vanetta Mulders [phone]) on 05/28/2020 12:06:50 PM   Radiology DG Chest Port 1 View  Result Date: 05/28/2020 CLINICAL DATA:  Possible aspiration. EXAM: PORTABLE CHEST 1 VIEW COMPARISON:  May 08, 2018. FINDINGS: The heart size and mediastinal contours are within normal limits. Both lungs are clear. The visualized skeletal structures are unremarkable. IMPRESSION: No active disease. Aortic Atherosclerosis (ICD10-I70.0). Electronically Signed   By: Lupita Raider M.D.   On: 05/28/2020 11:55   CT HEAD CODE STROKE WO CONTRAST  Addendum Date: 05/28/2020   ADDENDUM REPORT: 05/28/2020 11:49 ADDENDUM: Study discussed by telephone with Dr. Vanetta Mulders on 05/28/2020 at 1128 hours. Electronically Signed   By: Odessa Fleming M.D.   On: 05/28/2020 11:49   Result Date: 05/28/2020 CLINICAL DATA:  Code stroke.  87 year old female. EXAM: CT HEAD WITHOUT CONTRAST TECHNIQUE: Contiguous axial images were obtained from the base of the skull through the vertex without intravenous contrast. COMPARISON:  Head CT 04/06/2004. FINDINGS: Brain: Cerebral volume does not appear significantly changed since 2005 and is normal for age. No midline shift, ventriculomegaly, mass effect, evidence of mass lesion, intracranial hemorrhage or evidence of cortically based acute infarction. Scattered chronic patchy white matter hypodensity, with progression at the left anterior deep white matter capsule since 2005. Otherwise preserved gray-white matter differentiation. No cortical encephalomalacia identified. Vascular: Calcified atherosclerosis at the skull base. No suspicious intracranial vascular hyperdensity. Skull: No acute osseous abnormality identified. Sinuses/Orbits: Visualized paranasal sinuses and mastoids are stable and well pneumatized. Other: Stable and negative orbit and scalp soft tissues. ASPECTS [facility] Stroke Program Early CT Score) Total score (0-10 with 10 being normal): 10 IMPRESSION: 1. No acute cortically based infarct or acute intracranial hemorrhage identified. ASPECTS 10. 2. Chronic white matter disease appears with mild progression since 2005. Electronically Signed: By: Odessa Fleming M.D. On: 05/28/2020 11:27    Procedures Procedures   CRITICAL CARE Performed by: Vanetta Mulders Total critical care time: 45 minutes Critical care time was exclusive of separately billable procedures and treating other patients. Critical care was necessary to treat or prevent imminent or life-threatening deterioration. Critical care was time spent personally by me on the following activities: development of treatment plan with patient and/or surrogate as well as nursing, discussions with consultants, evaluation of patient's response to treatment,  examination of patient, obtaining history from patient or surrogate, ordering and performing treatments and interventions, ordering and review of laboratory studies, ordering and review of radiographic studies,  pulse oximetry and re-evaluation of patient's condition.   Medications Ordered in ED Medications - No data to display  ED Course  I have reviewed the triage vital signs and the nursing notes.  Pertinent labs & imaging results that were available during my care of the patient were reviewed by me and considered in my medical decision making (see chart for details).    MDM Rules/Calculators/A&P                          When patient came back from CT.  And placed in room 7 she was moving her upper extremities.  She was starting to talk some.  Still seemed a little bit slurred.  Patient interviewed by Dr. Amada Jupiter from teleneurology.  Head CT read as normal.  I informed him of her initial presentation it was not clear what was going on whether CVA he felt that there was still some speech problems.  Or whether maybe there was a seizure but then the seizure does not explain the abnormal speech earlier in the day could have explained her presentation when she rolled to the door with EMS.  Plan is to admit have neurology consult her on her and an MRI in the morning.  He did not feel that CT angio was necessary.  As time went on patient became much more verbal and would follow commands.  Was moving all extremities.    Final Clinical Impression(s) / ED Diagnoses Final diagnoses:  Aphasia  Altered mental status, unspecified altered mental status type  Dysarthria    Rx / DC Orders ED Discharge Orders    None       Vanetta Mulders, MD 05/28/20 1526

## 2020-05-28 NOTE — ED Triage Notes (Signed)
Pt brought to ED via [facility] for loss of speech, loss of mobility, AMS when son talked to her this morning at 89, son spoke to her at 0620 and she wasn't speaking normal. Pt lives alone, was not seen by family yesterday. Per EMS, pt did not have family contact yesterday. Teleneuro RN calling son to confirm LKW. CBG 194 upon arrival.

## 2020-05-28 NOTE — H&P (Signed)
History and Physical    [name] QQU:[id number] DOB: [birth year] DOA: 05/28/2020  PCP: Elfredia Nevins, MD   Patient coming from: Home  I have personally briefly reviewed patient's old medical records in [facility]  Chief Complaint: Confusion and dysarthria.  HPI: [name] is a 87 y.o. female with medical history significant of asthma, hyperlipidemia, hypertension, mitral regurgitation gastroesophageal flux disease anxiety and osteoarthritis; who presented to the hospital secondary to acute episode of confusion, dysarthria and flaccid limbs.  Patient was last seen normal around 8:30 AM on 05/27/20.  By the time of my examination patient was back to baseline and not really understanding why she was in the hospital for.  Based on information/history by EDP provider, neurologist, patient's Son and EMS report, patient woke up with slurred speech at that time noted son was calling her to check on her; couple hours after that initial phone call on his follow-up he noted the patient having difficulty not only talking but also finding words.  EMS was activated.  Patient was transported to the hospital for further evaluation and management.  On arrival was found to be drooling, confused, mumbling words and with flaccid limbs.  Appropriate action for code stroke initiated, no acute intracranial normalities appreciated on CT head.  Patient symptoms started to improve remarkably better by the time the neurologist was evaluating her. Patient denies fever, chills, dysuria, hematuria, melena, hematochezia, nausea, vomiting, chest pain, abdominal pain, headaches, blurred vision or any other complaints.  ED Course: CT scan negative for acute intracranial normalities.  Chest x-ray without acute cardiopulmonary process.  Patient symptoms in the resolving after approximately 3 to 3-1/2 hours.  No TPA given due to being out of therapeutic window.  Neurology consultation appreciated; plan is for  observation placement and TIA/stroke work-up completion.  Review of Systems: As per HPI otherwise all other systems reviewed and are negative.   Past Medical History:  Diagnosis Date  . Asthma   . Chronic headaches   . Coronary artery disease   . Hyperlipidemia   . Hypertension   . Mitral regurgitation   . Stroke [facility])     Past Surgical History:  Procedure Laterality Date  . APPENDECTOMY  1942  . BACK SURGERY    . CHOLECYSTECTOMY  1994  . PARTIAL HYSTERECTOMY  1973  . ROTATOR CUFF REPAIR      Social History  reports that she has quit smoking. She has never used smokeless tobacco. She reports that she does not drink alcohol and does not use drugs.  Allergies  Allergen Reactions  . Gabapentin     Other reaction(s): Unknown  . Morphine And Related     Family stated, "Morphine makes her wild and crazy"  . Percocet [Oxycodone-Acetaminophen]   . Povidone-Iodine     Other reaction(s): Unknown  . Prednisone     Family History  Problem Relation Age of Onset  . Coronary artery disease Mother   . Diabetes Father   . COPD Father     Prior to Admission medications   Medication Sig Start Date End Date Taking? Authorizing Provider  acetaminophen (TYLENOL) 650 MG CR tablet Take 650 mg by mouth every 8 (eight) hours as needed for pain or fever.   Yes [provider]  albuterol (PROVENTIL) (2.5 MG/3ML) 0.083% nebulizer solution Take 2.5 mg by nebulization 4 (four) times daily.   Yes [provider]  alprazolam Prudy Feeler) 2 MG tablet Take 2 mg by mouth 3 (three) times daily  as needed for sleep.   Yes [provider]  aspirin EC 81 MG tablet Take 81 mg by mouth daily.   Yes [provider]  COD LIVER OIL W/VIT A & D PO Take 1 tablet by mouth daily.   Yes [provider]  fluticasone (FLONASE) 50 MCG/ACT nasal spray Place 1 spray into the nose daily.   Yes [provider]  insulin NPH-insulin regular (NOVOLIN 70/30) (70-30) 100  UNIT/ML injection Inject 25 Units into the skin 2 (two) times daily.   Yes [provider]  ipratropium (ATROVENT) 0.02 % nebulizer solution Take 500 mcg by nebulization 4 (four) times daily.   Yes [provider]  losartan (COZAAR) 100 MG tablet Take 100 mg by mouth daily. 04/05/20  Yes [provider]  metoprolol succinate (TOPROL-XL) 25 MG 24 hr tablet Take 25 mg by mouth daily. 04/05/20  Yes [provider]  Multiple Vitamins-Minerals (PROTEGRA PO) Take 1 tablet by mouth daily.   Yes [provider]  pantoprazole (PROTONIX) 40 MG tablet Take 40 mg by mouth daily.   Yes [provider]    Physical Exam: Vitals:   05/28/20 1221 05/28/20 1300 05/28/20 1500 05/28/20 1713  BP: (!) 130/59 (!) 164/62 (!) 146/81   Pulse: 75 68 69 62  Resp: 19 17 16 16   Temp:    98.8 F (37.1 C)  TempSrc:      SpO2: 98% 96% 96% 98%  Weight:        Constitutional: NAD, speaking in full sentences, denies chest pain, nausea, no dysarthria or focal motor deficits on examination. Vitals:   05/28/20 1221 05/28/20 1300 05/28/20 1500 05/28/20 1713  BP: (!) 130/59 (!) 164/62 (!) 146/81   Pulse: 75 68 69 62  Resp: 19 17 16 16   Temp:    98.8 F (37.1 C)  TempSrc:      SpO2: 98% 96% 96% 98%  Weight:       Eyes: PERRL, lids and conjunctivae normal, no icterus, no nystagmus. ENMT: Mucous membranes are moist. Posterior pharynx clear of any exudate or lesions. Neck: normal, supple, no masses, no thyromegaly Respiratory: clear to auscultation bilaterally, no wheezing, no crackles. Normal respiratory effort. No accessory muscle use.  Cardiovascular: S1 and S2, positive murmur appreciated on examination; no rubs, no gallops, no JVD. Abdomen: no tenderness, no masses palpated. No hepatosplenomegaly. Bowel sounds positive.  Musculoskeletal: Decreased range of motion of her knees bilaterally; trace edema appreciated.  No cyanosis or clubbing. Skin: no rashes, no  petechiae. Neurologic: CN 2-12 grossly intact. Sensation intact, muscle strength 4/5 bilaterally in the setting of poor effort. Psychiatric: Normal judgment and insight. Alert and oriented x 3. Normal mood.   Labs on Admission: I have personally reviewed following labs and imaging studies  CBC: Recent Labs  Lab 05/28/20 1125 05/28/20 1146  WBC 7.5  --   NEUTROABS 3.1  --   HGB 13.1 14.6  HCT 38.9 43.0  MCV 103.7*  --   PLT 210  --     Basic Metabolic Panel: Recent Labs  Lab 05/28/20 1125 05/28/20 1146  NA 131* 132*  K 4.6 4.9  CL 97* 95*  CO2 26  --   GLUCOSE 198* 190*  BUN 9 10  CREATININE 0.67 0.70  CALCIUM 9.0  --     GFR: CrCl cannot be calculated (Unknown ideal weight.).  Liver Function Tests: Recent Labs  Lab 05/28/20 1125  AST 20  ALT 15  ALKPHOS 41  BILITOT 1.2  PROT 6.7  ALBUMIN 3.9    Urine analysis:    Component Value Date/Time   COLORURINE YELLOW 05/28/2020 1549   APPEARANCEUR CLEAR 05/28/2020 1549   LABSPEC 1.008 05/28/2020 1549   PHURINE 6.0 05/28/2020 1549   GLUCOSEU NEGATIVE 05/28/2020 1549   HGBUR NEGATIVE 05/28/2020 1549   BILIRUBINUR NEGATIVE 05/28/2020 1549   KETONESUR NEGATIVE 05/28/2020 1549   PROTEINUR NEGATIVE 05/28/2020 1549   NITRITE NEGATIVE 05/28/2020 1549   LEUKOCYTESUR LARGE (A) 05/28/2020 1549    Radiological Exams on Admission: DG Chest Port 1 View  Result Date: 05/28/2020 CLINICAL DATA:  Possible aspiration. EXAM: PORTABLE CHEST 1 VIEW COMPARISON:  May 08, 2018. FINDINGS: The heart size and mediastinal contours are within normal limits. Both lungs are clear. The visualized skeletal structures are unremarkable. IMPRESSION: No active disease. Aortic Atherosclerosis (ICD10-I70.0). Electronically Signed   By: Lupita Raider M.D.   On: 05/28/2020 11:55   CT HEAD CODE STROKE WO CONTRAST  Addendum Date: 05/28/2020   ADDENDUM REPORT: 05/28/2020 11:49 ADDENDUM: Study discussed by telephone with Dr. Vanetta Mulders on  05/28/2020 at 1128 hours. Electronically Signed   By: Odessa Fleming M.D.   On: 05/28/2020 11:49   Result Date: 05/28/2020 CLINICAL DATA:  Code stroke.  87 year old female. EXAM: CT HEAD WITHOUT CONTRAST TECHNIQUE: Contiguous axial images were obtained from the base of the skull through the vertex without intravenous contrast. COMPARISON:  Head CT 04/06/2004. FINDINGS: Brain: Cerebral volume does not appear significantly changed since 2005 and is normal for age. No midline shift, ventriculomegaly, mass effect, evidence of mass lesion, intracranial hemorrhage or evidence of cortically based acute infarction. Scattered chronic patchy white matter hypodensity, with progression at the left anterior deep white matter capsule since 2005. Otherwise preserved gray-white matter differentiation. No cortical encephalomalacia identified. Vascular: Calcified atherosclerosis at the skull base. No suspicious intracranial vascular hyperdensity. Skull: No acute osseous abnormality identified. Sinuses/Orbits: Visualized paranasal sinuses and mastoids are stable and well pneumatized. Other: Stable and negative orbit and scalp soft tissues. ASPECTS [facility] Stroke Program Early CT Score) Total score (0-10 with 10 being normal): 10 IMPRESSION: 1. No acute cortically based infarct or acute intracranial hemorrhage identified. ASPECTS 10. 2. Chronic white matter disease appears with mild progression since 2005. Electronically Signed: By: Odessa Fleming M.D. On: 05/28/2020 11:27    EKG: Independently reviewed.  No acute ischemic changes appreciated.  Sinus rhythm.  Assessment/Plan 1-TIA (transient ischemic attack) -Patient with strokelike symptoms that completely resolved by the time of my evaluation -Following recommendations by neurology service patient will be admitted for a stroke work-up -CT scan without acute intracranial abnormalities. -Will check TSH, B12, lipid panel, A1c, MRI, 2D echo and carotid Dopplers. -Checking EEG to rule out  seizure, also checking UA and urine culture to rule out component of infection -Chest x-ray without acute cardiopulmonary process. -Covid PCR negative -Patient has passed swallowing evaluation. -Allow for permissive hypertension. -Telemetry monitoring. -Will follow further recommendations by neurology service -Aspirin for secondary prevention has been ordered.  2-hypertension -Permissive hypertension with Flagyl -Resuming home antihypertensive agent -Follow vital signs.  3-type 2 diabetes mellitus -will check A1c -Sliding scale insulin and Levemir will be provided -Modified carbohydrate diet has been ordered.  4-history of anxiety -Continue as needed Xanax.  5-hx of asthma/allergic rhinitis -No wheezing or complaints of respiratory distress -Continue the use of Flonase and as needed bronchodilators.  6-hyponatremia -Gentle hydration will be provided -Follow electrolytes trend  7-GERD -Continue PPI.   DVT prophylaxis: Heparin.  Code Status:   Full code Family Communication:  No family at bedside. Disposition Plan:   Patient is from:  Home  Anticipated DC to:  Home with home health services.  Anticipated DC date:  05/29/2020  Anticipated DC barriers: Completion of TIA/stroke work-up Consults called:  Neurology service Admission status:  Telemetry bed, observation status, length of stay less than 2 midnights.  Severity of Illness: Mild illness; patient presented with acute episode of dysarthria, confusion, flaccid limbs and drooling.  Code stroke was activated on arrival to the ED.  Teleneurology was consulted, CT head negative for acute intracranial abnormalities.  In the lapses 3 hours patient symptoms remarkably improved and essentially return back to her baseline.  Recommendations given to place in the hospital for TIA/stroke work-up.    Vassie Loll MD [facility]  How to contact the [facility] Attending or Consulting provider 7A - 7P or covering provider during  after hours 7P -7A, for this patient?   1. Check the care team in [facility] and look for a) attending/consulting [facility] provider listed and b) the [facility] team listed 2. Log into [email or web address] and use [employer]'s universal password to access. If you do not have the password, please contact the hospital operator. 3. Locate the [facility] provider you are looking for under [facility] and page to a number that you can be directly reached. 4. If you still have difficulty reaching the provider, please page the [facility] (Director on Call) for the Hospitalists listed on amion for assistance.  05/28/2020, 5:24 PM

## 2020-05-28 NOTE — Consult Note (Addendum)
[facility] Telemedicine Consult   Requesting Provider: Darlyn Chamber Consult Participants: Bedside Location of the provider: [facility] Location of the patient:  [facility]  This consult was provided via telemedicine with 2-way video and audio communication. The patient/family was informed that care would be provided in this way and agreed to receive care in this manner.    Chief Complaint: Confusion  HPI: Patient was in her normal state of health yesterday morning.  Around 8:20 AM her son spoke with her on the phone.  Around 10 AM this morning, the patient called her son, and was slurring her words.  The patient is not sure why she was brought to the emergency department, and is unable to give me a clear history of the morning's events.  Per the son, she does have pretty significant difficulties with her short-term memory.  Apparently she was mostly unresponsive on arrival, but by the time I evaluated her after her head CT she had markedly improved.  At baseline, though she lives alone, she needs significantly help with her activities of daily living, does not typically cook for herself and needs significant assistance to bathe.   LKW: 8:20 AM 2/5 tpa given?: No, outside of window Time of teleneurologist evaluation: 11:18 am  Exam: Vitals:   05/28/20 1126  BP: (!) 160/70  Pulse: 71  Resp: (!) 22  SpO2: 100%     General: She is in bed, without clear distress  1A: Level of Consciousness - 0 1B: Ask Month and Age - 0 1C: 'Blink Eyes' & 'Squeeze Hands' - 0 2: Test Horizontal Extraocular Movements - 0 3: Test Visual Fields - 0 4: Test Facial Palsy - 0 5A: Test Left Arm Motor Drift - 0 5B: Test Right Arm Motor Drift - 0 6A: Test Left Leg Motor Drift - 2 -she is able to hold against gravity for a little bit, but drifts to the bed before 10 seconds. 6B: Test Right Leg Motor Drift - 3 -she makes little effort to move her right leg but is  able to wiggle her toes 7: Test Limb Ataxia -  8: Test Sensation - 0 9: Test Language/Aphasia- 0 10: Test Dysarthria - 1 11: Test Extinction/Inattention - 0 NIHSS score: 6   Imaging Reviewed: CT head was negative  Labs reviewed in epic and pertinent values follow: CBG 194   Assessment: 88 year old female with altered mental status of unclear etiology.  Possibilities include toxic/metabolic encephalopathy, stroke/TIA, seizure.  She will need further assessment to determine etiology.  Recommendations: 1) MRI brain without contrast 2) EEG 3) CMP, CBC, TSH, ammonia 4) assessment for infectious causes per internal medicine/emergency department 5) Inpatient neurology consultation   This patient is receiving care for possible acute neurological changes. There was 45 minutes of care by this provider at the time of service, including time for direct evaluation via telemedicine, review of medical records, imaging studies and discussion of findings with providers, the patient and/or family.  Ritta Slot, MD [facility] [phone]  If 7pm- 7am, please page neurology on call as listed in AMION.

## 2020-05-28 NOTE — Plan of Care (Signed)

## 2020-05-29 ENCOUNTER — Observation Stay (HOSPITAL_COMMUNITY): Payer: Medicare Other

## 2020-05-29 ENCOUNTER — Observation Stay (HOSPITAL_BASED_OUTPATIENT_CLINIC_OR_DEPARTMENT_OTHER): Payer: Medicare Other

## 2020-05-29 DIAGNOSIS — R471 Dysarthria and anarthria: Secondary | ICD-10-CM

## 2020-05-29 DIAGNOSIS — G459 Transient cerebral ischemic attack, unspecified: Secondary | ICD-10-CM | POA: Diagnosis not present

## 2020-05-29 DIAGNOSIS — I10 Essential (primary) hypertension: Secondary | ICD-10-CM | POA: Diagnosis not present

## 2020-05-29 DIAGNOSIS — R4182 Altered mental status, unspecified: Secondary | ICD-10-CM

## 2020-05-29 DIAGNOSIS — I351 Nonrheumatic aortic (valve) insufficiency: Secondary | ICD-10-CM

## 2020-05-29 LAB — LIPID PANEL
Cholesterol: 154 mg/dL (ref 0–200)
HDL: 48 mg/dL (ref >40)
LDL Cholesterol: 79 mg/dL (ref 0–99)
Total CHOL/HDL Ratio: 3.2 RATIO
Triglycerides: 134 mg/dL (ref <150)
VLDL: 27 mg/dL (ref 0–40)

## 2020-05-29 LAB — ECHOCARDIOGRAM COMPLETE
AR max vel: 1.31 cm2
AV Area VTI: 1.17 cm2
AV Area mean vel: 1.26 cm2
AV Mean grad: 6 mmHg
AV Peak grad: 13.4 mmHg
Ao pk vel: 1.83 m/s
Area-P 1/2: 6.12 cm2
Height: 60 in
S' Lateral: 2.26 cm
Weight: 2469.15 oz

## 2020-05-29 LAB — HEMOGLOBIN A1C
Hgb A1c MFr Bld: 6 % — ABNORMAL HIGH (ref 4.8–5.6)
Mean Plasma Glucose: 125.5 mg/dL

## 2020-05-29 LAB — GLUCOSE, CAPILLARY
Glucose-Capillary: 140 mg/dL — ABNORMAL HIGH (ref 70–99)
Glucose-Capillary: 199 mg/dL — ABNORMAL HIGH (ref 70–99)
Glucose-Capillary: 198 mg/dL — ABNORMAL HIGH (ref 70–99)
Glucose-Capillary: 124 mg/dL — ABNORMAL HIGH (ref 70–99)

## 2020-05-29 MED ORDER — ASPIRIN 325 MG PO TABS: 325.0000 mg | ORAL_TABLET | Freq: Every day | ORAL | 1 refills | Status: DC

## 2020-05-29 NOTE — Evaluation (Signed)
Speech Language Pathology Evaluation Patient Details Name: [name] MRN: [id number] DOB: 09/17/1925 Today's Date: 05/29/2020 Time: 5009-3818 SLP Time Calculation (min) (ACUTE ONLY): 27 min  Problem List:  Patient Active Problem List   Diagnosis Date Noted  . TIA (transient ischemic attack) 05/28/2020  . Cough 01/24/2012  . DYSPNEA 07/20/2009  . HYPERLIPIDEMIA 07/18/2009  . MITRAL REGURGITATION, 0 (MILD) 07/18/2009  . HYPERTENSION 07/18/2009  . CAD, NATIVE VESSEL 07/18/2009   Past Medical History:  Past Medical History:  Diagnosis Date  . Asthma   . Chronic headaches   . Coronary artery disease   . Hyperlipidemia   . Hypertension   . Mitral regurgitation   . Stroke [facility])    Past Surgical History:  Past Surgical History:  Procedure Laterality Date  . APPENDECTOMY  1942  . BACK SURGERY    . CHOLECYSTECTOMY  1994  . PARTIAL HYSTERECTOMY  1973  . ROTATOR CUFF REPAIR     HPI:  [name] is a 87 y.o. female with medical history significant of asthma, hyperlipidemia, hypertension, mitral regurgitation gastroesophageal flux disease anxiety and osteoarthritis; who presented to the hospital secondary to acute episode of confusion, dysarthria and flaccid limbs.  Patient was last seen normal around 8:30 AM on 05/27/20.  By the time of my examination patient was back to baseline and not really understanding why she was in the hospital for.  Based on information/history by EDP provider, neurologist, patient's Son and EMS report, patient woke up with slurred speech at that time noted son was calling her to check on her; couple hours after that initial phone call on his follow-up he noted the patient having difficulty not only talking but also finding words.  EMS was activated.  Patient was transported to the hospital for further evaluation and management.  On arrival was found to be drooling, confused, mumbling words and with flaccid limbs.  Appropriate action for code stroke  initiated, no acute intracranial normalities appreciated on CT head.  Patient symptoms started to improve remarkably better by the time the neurologist was evaluating her. MRI pending. SLE requested. Pt passed [employer].   Assessment / Plan / Recommendation Clinical Impression  Cognitive linguistic evaluation completed and Pt appears to be at baseline. No family present to confirm, however skills WNL for items assessed. Pt lives alone, but has assist from a son who lives nearby and a granddaughter. Pt without aphasia, dysarthria (mildly reduced intelligibility due to missing some dentition), or other cognitive changes. She has adequate support at home, per Pt, to assist with medication management (pill box). Pt did require frequent redirection in convesation to complete structured tasks, as she was very talkative about unrelated activities. Pt is eager to return home as soon as she can. No further SLP services indicated at this time. SLP will sign off.    SLP Assessment  SLP Recommendation/Assessment: Patient does not need any further Speech Lanaguage Pathology Services SLP Visit Diagnosis: Cognitive communication deficit (R41.841)    Follow Up Recommendations  None    Frequency and Duration           SLP Evaluation Cognition  Overall Cognitive Status: Within Functional Limits for tasks assessed Arousal/Alertness: Awake/alert Orientation Level: Oriented X4 Memory: Appears intact Awareness: Appears intact Problem Solving: Appears intact Safety/Judgment: Appears intact       Comprehension  Auditory Comprehension Overall Auditory Comprehension: Appears within functional limits for tasks assessed Yes/No Questions: Within Functional Limits Commands: Within Functional Limits Conversation: Complex Visual Recognition/Discrimination Discrimination: Within  Function Limits Reading Comprehension Reading Status: Not tested    Expression Expression Primary Mode of Expression: Verbal Verbal  Expression Overall Verbal Expression: Appears within functional limits for tasks assessed Initiation: No impairment Automatic Speech: Name;Social Response Level of Generative/Spontaneous Verbalization: Conversation Repetition: No impairment Naming: No impairment Pragmatics: Impairment Impairments: Turn Taking Interfering Components: Speech intelligibility Non-Verbal Means of Communication: Not applicable Written Expression Dominant Hand: Right Written Expression: Not tested   Oral / Motor  Motor Speech Overall Motor Speech: Appears within functional limits for tasks assessed Respiration: Within functional limits Phonation: Normal Resonance: Within functional limits Articulation: Impaired Level of Impairment: Conversation Intelligibility: Intelligibility reduced Conversation: 75-100% accurate Motor Planning: Witnin functional limits Interfering Components: Inadequate dentition   Thank you,  [name], CCC-SLP [phone]                     [name] 05/29/2020, 12:01 PM

## 2020-05-29 NOTE — Plan of Care (Signed)
  Problem: Education: Goal: Knowledge of General Education information will improve Description: Including pain rating scale, medication(s)/side effects and non-pharmacologic comfort measures Outcome: Progressing   Problem: Health Behavior/Discharge Planning: Goal: Ability to manage health-related needs will improve Outcome: Progressing   Problem: Clinical Measurements: Goal: Ability to maintain clinical measurements within normal limits will improve Outcome: Progressing Goal: Will remain free from infection Outcome: Progressing Goal: Diagnostic test results will improve Outcome: Progressing Goal: Respiratory complications will improve Outcome: Progressing Goal: Cardiovascular complication will be avoided Outcome: Progressing   Problem: Activity: Goal: Risk for activity intolerance will decrease Outcome: Progressing   Problem: Nutrition: Goal: Adequate nutrition will be maintained Outcome: Progressing   Problem: Coping: Goal: Level of anxiety will decrease Outcome: Progressing   Problem: Elimination: Goal: Will not experience complications related to bowel motility Outcome: Progressing Goal: Will not experience complications related to urinary retention Outcome: Progressing   Problem: Pain Managment: Goal: General experience of comfort will improve Outcome: Progressing   Problem: Safety: Goal: Ability to remain free from injury will improve Outcome: Progressing   Problem: Skin Integrity: Goal: Risk for impaired skin integrity will decrease Outcome: Progressing   Problem: Education: Goal: Knowledge of disease or condition will improve Outcome: Progressing Goal: Knowledge of secondary prevention will improve Outcome: Progressing Goal: Knowledge of patient specific risk factors addressed and post discharge goals established will improve Outcome: Progressing Goal: Individualized Educational Video(s) Outcome: Progressing   Problem: Coping: Goal: Will verbalize  positive feelings about self Outcome: Progressing Goal: Will identify appropriate support needs Outcome: Progressing   Problem: Self-Care: Goal: Ability to participate in self-care as condition permits will improve Outcome: Progressing   Problem: Health Behavior/Discharge Planning: Goal: Ability to manage health-related needs will improve Outcome: Progressing   Problem: Ischemic Stroke/TIA Tissue Perfusion: Goal: Complications of ischemic stroke/TIA will be minimized Outcome: Progressing

## 2020-05-29 NOTE — Progress Notes (Signed)
OT Cancellation Note  Patient Details Name: [name] MRN: [id number] DOB: [birth year]   Cancelled Treatment:    Reason Eval/Treat Not Completed: Patient at procedure or test/ unavailable. Two attempts were made to see patient for OT evaluation this AM.  First attempt, patient was in room completing testing. Second attempt, SLP was with patient completing evaluation.  Will re-attempt OT evaluation when able.    Limmie Patricia, OTR/L,CBIS  [phone]  05/29/2020, 9:38 AM

## 2020-05-29 NOTE — Plan of Care (Signed)
Problem: Education: Goal: Knowledge of General Education information will improve Description: Including pain rating scale, medication(s)/side effects and non-pharmacologic comfort measures 05/29/2020 0930 by Sheela Stack, RN Outcome: Progressing 05/29/2020 0855 by Sheela Stack, RN Outcome: Progressing   Problem: Health Behavior/Discharge Planning: Goal: Ability to manage health-related needs will improve 05/29/2020 0930 by Sheela Stack, RN Outcome: Progressing 05/29/2020 0855 by Sheela Stack, RN Outcome: Progressing   Problem: Clinical Measurements: Goal: Ability to maintain clinical measurements within normal limits will improve 05/29/2020 0930 by Sheela Stack, RN Outcome: Progressing 05/29/2020 0855 by Sheela Stack, RN Outcome: Progressing Goal: Will remain free from infection 05/29/2020 0930 by Sheela Stack, RN Outcome: Progressing 05/29/2020 0855 by Sheela Stack, RN Outcome: Progressing Goal: Diagnostic test results will improve 05/29/2020 0930 by Sheela Stack, RN Outcome: Progressing 05/29/2020 0855 by Sheela Stack, RN Outcome: Progressing Goal: Respiratory complications will improve 05/29/2020 0930 by Sheela Stack, RN Outcome: Progressing 05/29/2020 0855 by Sheela Stack, RN Outcome: Progressing Goal: Cardiovascular complication will be avoided 05/29/2020 0930 by Sheela Stack, RN Outcome: Progressing 05/29/2020 0855 by Sheela Stack, RN Outcome: Progressing   Problem: Activity: Goal: Risk for activity intolerance will decrease 05/29/2020 0930 by Sheela Stack, RN Outcome: Progressing 05/29/2020 0855 by Sheela Stack, RN Outcome: Progressing   Problem: Nutrition: Goal: Adequate nutrition will be maintained 05/29/2020 0930 by Sheela Stack, RN Outcome: Progressing 05/29/2020 0855 by Sheela Stack, RN Outcome: Progressing   Problem: Coping: Goal: Level of anxiety will decrease 05/29/2020 0930 by Sheela Stack, RN Outcome:  Progressing 05/29/2020 0855 by Sheela Stack, RN Outcome: Progressing   Problem: Elimination: Goal: Will not experience complications related to bowel motility 05/29/2020 0930 by Sheela Stack, RN Outcome: Progressing 05/29/2020 0855 by Sheela Stack, RN Outcome: Progressing Goal: Will not experience complications related to urinary retention 05/29/2020 0930 by Sheela Stack, RN Outcome: Progressing 05/29/2020 0855 by Sheela Stack, RN Outcome: Progressing   Problem: Pain Managment: Goal: General experience of comfort will improve 05/29/2020 0930 by Sheela Stack, RN Outcome: Progressing 05/29/2020 0855 by Sheela Stack, RN Outcome: Progressing   Problem: Safety: Goal: Ability to remain free from injury will improve 05/29/2020 0930 by Sheela Stack, RN Outcome: Progressing 05/29/2020 0855 by Sheela Stack, RN Outcome: Progressing   Problem: Skin Integrity: Goal: Risk for impaired skin integrity will decrease 05/29/2020 0930 by Sheela Stack, RN Outcome: Progressing 05/29/2020 0855 by Sheela Stack, RN Outcome: Progressing   Problem: Education: Goal: Knowledge of disease or condition will improve 05/29/2020 0930 by Sheela Stack, RN Outcome: Progressing 05/29/2020 0855 by Sheela Stack, RN Outcome: Progressing Goal: Knowledge of secondary prevention will improve 05/29/2020 0930 by Sheela Stack, RN Outcome: Progressing 05/29/2020 0855 by Sheela Stack, RN Outcome: Progressing Goal: Knowledge of patient specific risk factors addressed and post discharge goals established will improve 05/29/2020 0930 by Sheela Stack, RN Outcome: Progressing 05/29/2020 0855 by Sheela Stack, RN Outcome: Progressing Goal: Individualized Educational Video(s) 05/29/2020 0930 by Sheela Stack, RN Outcome: Progressing 05/29/2020 0855 by Sheela Stack, RN Outcome: Progressing   Problem: Coping: Goal: Will verbalize positive feelings about self 05/29/2020 0930 by Sheela Stack,  RN Outcome: Progressing 05/29/2020 0855 by Sheela Stack, RN Outcome: Progressing Goal: Will identify appropriate support needs 05/29/2020 0930 by Sheela Stack, RN Outcome: Progressing 05/29/2020 0855 by Sheela Stack, RN Outcome: Progressing  Problem: Health Behavior/Discharge Planning: Goal: Ability to manage health-related needs will improve 05/29/2020 0930 by Sheela Stack, RN Outcome: Progressing 05/29/2020 0855 by Sheela Stack, RN Outcome: Progressing   Problem: Self-Care: Goal: Ability to participate in self-care as condition permits will improve 05/29/2020 0930 by Sheela Stack, RN Outcome: Progressing 05/29/2020 0855 by Sheela Stack, RN Outcome: Progressing   Problem: Ischemic Stroke/TIA Tissue Perfusion: Goal: Complications of ischemic stroke/TIA will be minimized 05/29/2020 0930 by Sheela Stack, RN Outcome: Progressing 05/29/2020 0855 by Sheela Stack, RN Outcome: Progressing

## 2020-05-29 NOTE — TOC Transition Note (Signed)
Transition of Care [facility]) - CM/SW Discharge Note   Patient Details  Name: HAYDEN MABIN MRN: [id number] Date of Birth: Jun 28, 1925  Transition of Care [facility]) CM/SW Contact:  Barry Brunner, LCSW Phone Number: 05/29/2020, 5:13 PM   Clinical Narrative:    CSW received notification of patient's readiness for discharge. CSW contacted Denyse Amass with [facility]. Denyse Amass agreeable to provide [facility] services. CSW contacted EMS and completed med necessity. TOC signing off.    Final next level of care: Home w Home Health Services Barriers to Discharge: Barriers Resolved   Patient Goals and CMS Choice Patient states their goals for this hospitalization and ongoing recovery are:: Return home with [facility] CMS Medicare.gov Compare Post Acute Care list provided to:: Patient Choice offered to / list presented to : Patient  Discharge Placement                    Patient and family notified of of transfer: 05/29/20  Discharge Plan and Services                DME Arranged: N/A DME Agency: NA       HH Arranged: PT HH Agency: [facility] Date HH Agency Contacted: 05/29/20 Time HH Agency Contacted: 1710 Representative spoke with at [facility] Agency: Rosey Bath  Social Determinants of Health (SDOH) Interventions     Readmission Risk Interventions No flowsheet data found.

## 2020-05-29 NOTE — Plan of Care (Signed)
  Problem: Acute Rehab PT Goals(only PT should resolve) Goal: Pt Will Go Supine/Side To Sit Outcome: Progressing Flowsheets (Taken 05/29/2020 1420) Pt will go Supine/Side to Sit: with supervision Goal: Pt Will Go Sit To Supine/Side Outcome: Progressing Flowsheets (Taken 05/29/2020 1420) Pt will go Sit to Supine/Side: with supervision Goal: Patient Will Transfer Sit To/From Stand Outcome: Progressing Flowsheets (Taken 05/29/2020 1420) Patient will transfer sit to/from stand: with supervision Goal: Pt Will Transfer Bed To Chair/Chair To Bed Outcome: Progressing Flowsheets (Taken 05/29/2020 1420) Pt will Transfer Bed to Chair/Chair to Bed: with supervision Goal: Pt Will Ambulate Outcome: Progressing Flowsheets (Taken 05/29/2020 1420) Pt will Ambulate:  50 feet  with rolling walker  with min guard assist Britta Mccreedy D. Hartnett-Rands, MS, PT [facility] PT [facility] [phone] 05/29/2020

## 2020-05-29 NOTE — Progress Notes (Signed)
  Echocardiogram 2D Echocardiogram has been performed.  Pieter Partridge 05/29/2020, 9:26 AM

## 2020-05-29 NOTE — Evaluation (Signed)
Physical Therapy Evaluation Patient Details Name: JOYCELIN [name] MRN: [id number] DOB: [birth year] Today's Date: 05/29/2020   History of Present Illness  [name] is a 87 y.o. female with medical history significant of asthma, hyperlipidemia, hypertension, mitral regurgitation gastroesophageal flux disease anxiety and osteoarthritis; who presented to the hospital secondary to acute episode of confusion, dysarthria and flaccid limbs.  Patient was last seen normal around 8:30 AM on 05/27/20.  By the time of my examination patient was back to baseline and not really understanding why she was in the hospital for.  Based on information/history by EDP provider, neurologist, patient's Son and EMS report, patient woke up with slurred speech at that time noted son was calling her to check on her; couple hours after that initial phone call on his follow-up he noted the patient having difficulty not only talking but also finding words.  EMS was activated.  Patient was transported to the hospital for further evaluation and management.  On arrival was found to be drooling, confused, mumbling words and with flaccid limbs.  Appropriate action for code stroke initiated, no acute intracranial normalities appreciated on CT head.  Patient symptoms started to improve remarkably better by the time the neurologist was evaluating her. MRI pending. SLE requested. Pt passed [employer].    Clinical Impression  Pt admitted with above diagnosis. Patient agreeable to participating in PT evaluation today. Patient performed bed mobility with min assist to supervision. Patient performed transfers and ambulation with RW and min guard to supervision assist. Patient did have one instance of loss of balance posteriorly while standing in bathroom but was able to regain her balance independently by utilizing the RW. Patient reports her son and granddaughter are available to help her when needed. Pt currently with functional limitations due  to the deficits listed below (see PT Problem List). Pt will benefit from skilled PT to increase their independence and safety with mobility to allow discharge to the venue listed below.       Follow Up Recommendations Home health PT;SNF;Supervision - Intermittent;Supervision for mobility/OOB    Equipment Recommendations       Recommendations for Other Services       Precautions / Restrictions Precautions Precautions: Fall Precaution Comments: reports 1 fall in 6 months; 6 times in last 2 years Restrictions Weight Bearing Restrictions: No      Mobility  Bed Mobility Overal bed mobility: Needs Assistance Bed Mobility: Supine to Sit;Sit to Supine     Supine to sit: Min assist;HOB elevated Sit to supine: Min assist   General bed mobility comments: assist for trunk    Transfers Overall transfer level: Needs assistance Equipment used: Rolling walker (2 wheeled) Transfers: Sit to/from [employer] Sit to Stand: Min guard Stand pivot transfers: Min guard       General transfer comment: somewhat impulsive  Ambulation/Gait Ambulation/Gait assistance: Min guard Gait Distance (Feet): 20 Feet (10x2) Assistive device: Rolling walker (2 wheeled) Gait Pattern/deviations: Step-through pattern;Decreased step length - right;Decreased step length - left;Decreased stride length;Wide base of support Gait velocity: decreased   General Gait Details: somewhat impulsive ambulation with RW; short equal length steps; on room air  Stairs     Wheelchair Mobility    Modified Rankin (Stroke Patients Only)       Balance Overall balance assessment: Needs assistance Sitting-balance support: Bilateral upper extremity supported;Feet supported Sitting balance-Leahy Scale: Fair     Standing balance support: Bilateral upper extremity supported;During functional activity Standing balance-Leahy Scale: Fair Standing balance comment:  fair with RW         Pertinent  Vitals/Pain Pain Assessment: No/denies pain    Home Living Family/patient expects to be discharged to:: Private residence Living Arrangements: Alone Available Help at Discharge: Family (Son lives close by and a granddaughter assists) Type of Home: House Home Access: Ramped entrance     Home Layout: One level Home Equipment: Environmental consultant - 4 wheels;Walker - 2 wheels;Grab bars - tub/shower;Bedside commode;Tub bench      Prior Function Level of Independence: Needs assistance   Gait / Transfers Assistance Needed: ambulates with RW and Rollator household distances.  ADL's / Homemaking Assistance Needed: independent BADLs;  assistance for grocery shopping and meal set up        Hand Dominance   Dominant Hand: Right    Extremity/Trunk Assessment   Upper Extremity Assessment Upper Extremity Assessment: Generalized weakness    Lower Extremity Assessment Lower Extremity Assessment: Generalized weakness    Cervical / Trunk Assessment Cervical / Trunk Assessment: Kyphotic  Communication   Communication: No difficulties  Cognition Arousal/Alertness: Awake/alert Behavior During Therapy: WFL for tasks assessed/performed Overall Cognitive Status: Within Functional Limits for tasks assessed   =   General Comments      Exercises     Assessment/Plan    PT Assessment Patient needs continued PT services  PT Problem List Decreased strength;Decreased activity tolerance;Decreased balance;Decreased mobility       PT Treatment Interventions DME instruction;Balance training;Gait training;Functional mobility training;Patient/family education;Therapeutic activities;Therapeutic exercise    PT Goals (Current goals can be found in the Care Plan section)  Acute Rehab PT Goals Patient Stated Goal: Go home; not go to rehab or nursing home. PT Goal Formulation: With patient Time For Goal Achievement: 06/12/20 Potential to Achieve Goals: Fair    Frequency Min 3X/week   Barriers to  discharge           AM-PAC PT "6 Clicks" Mobility  Outcome Measure Help needed turning from your back to your side while in a flat bed without using bedrails?: A Little Help needed moving from lying on your back to sitting on the side of a flat bed without using bedrails?: A Lot     Help needed to walk in hospital room?: A Little Help needed climbing 3-5 steps with a railing? : A Lot 6 Click Score: 10    End of Session Equipment Utilized During Treatment: Gait belt Activity Tolerance: Patient tolerated treatment well Patient left: in bed;with call bell/phone within reach;with bed alarm set Nurse Communication: Mobility status PT Visit Diagnosis: Unsteadiness on feet (R26.81);History of falling (Z91.81);Muscle weakness (generalized) (M62.81);Other abnormalities of gait and mobility (R26.89)    Time: 1330-1415 PT Time Calculation (min) (ACUTE ONLY): 45 min   Charges:   PT Evaluation $PT Eval Moderate Complexity: 1 Mod PT Treatments $Therapeutic Activity: 23-37 mins        Katina Dung. Hartnett-Rands, MS, PT Per Diem PT [facility]  [phone] 05/29/2020, 2:08 PM

## 2020-05-29 NOTE — Plan of Care (Signed)
Problem: Education: Goal: Knowledge of General Education information will improve Description: Including pain rating scale, medication(s)/side effects and non-pharmacologic comfort measures 05/29/2020 1723 by Sheela Stack, RN Outcome: Adequate for Discharge 05/29/2020 0930 by Sheela Stack, RN Outcome: Progressing 05/29/2020 0855 by Sheela Stack, RN Outcome: Progressing   Problem: Health Behavior/Discharge Planning: Goal: Ability to manage health-related needs will improve 05/29/2020 1723 by Sheela Stack, RN Outcome: Adequate for Discharge 05/29/2020 0930 by Sheela Stack, RN Outcome: Progressing 05/29/2020 0855 by Sheela Stack, RN Outcome: Progressing   Problem: Clinical Measurements: Goal: Ability to maintain clinical measurements within normal limits will improve 05/29/2020 1723 by Sheela Stack, RN Outcome: Adequate for Discharge 05/29/2020 0930 by Sheela Stack, RN Outcome: Progressing 05/29/2020 0855 by Sheela Stack, RN Outcome: Progressing Goal: Will remain free from infection 05/29/2020 1723 by Sheela Stack, RN Outcome: Adequate for Discharge 05/29/2020 0930 by Sheela Stack, RN Outcome: Progressing 05/29/2020 0855 by Sheela Stack, RN Outcome: Progressing Goal: Diagnostic test results will improve 05/29/2020 1723 by Sheela Stack, RN Outcome: Adequate for Discharge 05/29/2020 0930 by Sheela Stack, RN Outcome: Progressing 05/29/2020 0855 by Sheela Stack, RN Outcome: Progressing Goal: Respiratory complications will improve 05/29/2020 1723 by Sheela Stack, RN Outcome: Adequate for Discharge 05/29/2020 0930 by Sheela Stack, RN Outcome: Progressing 05/29/2020 0855 by Sheela Stack, RN Outcome: Progressing Goal: Cardiovascular complication will be avoided 05/29/2020 1723 by Sheela Stack, RN Outcome: Adequate for Discharge 05/29/2020 0930 by Sheela Stack, RN Outcome: Progressing 05/29/2020 0855 by Sheela Stack, RN Outcome: Progressing    Problem: Activity: Goal: Risk for activity intolerance will decrease 05/29/2020 1723 by Sheela Stack, RN Outcome: Adequate for Discharge 05/29/2020 0930 by Sheela Stack, RN Outcome: Progressing 05/29/2020 0855 by Sheela Stack, RN Outcome: Progressing   Problem: Nutrition: Goal: Adequate nutrition will be maintained 05/29/2020 1723 by Sheela Stack, RN Outcome: Adequate for Discharge 05/29/2020 0930 by Sheela Stack, RN Outcome: Progressing 05/29/2020 0855 by Sheela Stack, RN Outcome: Progressing   Problem: Coping: Goal: Level of anxiety will decrease 05/29/2020 1723 by Sheela Stack, RN Outcome: Adequate for Discharge 05/29/2020 0930 by Sheela Stack, RN Outcome: Progressing 05/29/2020 0855 by Sheela Stack, RN Outcome: Progressing   Problem: Elimination: Goal: Will not experience complications related to bowel motility 05/29/2020 1723 by Sheela Stack, RN Outcome: Adequate for Discharge 05/29/2020 0930 by Sheela Stack, RN Outcome: Progressing 05/29/2020 0855 by Sheela Stack, RN Outcome: Progressing Goal: Will not experience complications related to urinary retention 05/29/2020 1723 by Sheela Stack, RN Outcome: Adequate for Discharge 05/29/2020 0930 by Sheela Stack, RN Outcome: Progressing 05/29/2020 0855 by Sheela Stack, RN Outcome: Progressing   Problem: Pain Managment: Goal: General experience of comfort will improve 05/29/2020 1723 by Sheela Stack, RN Outcome: Adequate for Discharge 05/29/2020 0930 by Sheela Stack, RN Outcome: Progressing 05/29/2020 0855 by Sheela Stack, RN Outcome: Progressing   Problem: Safety: Goal: Ability to remain free from injury will improve 05/29/2020 1723 by Sheela Stack, RN Outcome: Adequate for Discharge 05/29/2020 0930 by Sheela Stack, RN Outcome: Progressing 05/29/2020 0855 by Sheela Stack, RN Outcome: Progressing   Problem: Skin Integrity: Goal: Risk for impaired skin integrity will decrease 05/29/2020  1723 by Sheela Stack, RN Outcome: Adequate for Discharge 05/29/2020 0930 by Sheela Stack, RN Outcome: Progressing 05/29/2020 0855 by Sheela Stack, RN  Outcome: Progressing   Problem: Education: Goal: Knowledge of disease or condition will improve 05/29/2020 1723 by Sheela Stack, RN Outcome: Adequate for Discharge 05/29/2020 0930 by Sheela Stack, RN Outcome: Progressing 05/29/2020 0855 by Sheela Stack, RN Outcome: Progressing Goal: Knowledge of secondary prevention will improve 05/29/2020 1723 by Sheela Stack, RN Outcome: Adequate for Discharge 05/29/2020 0930 by Sheela Stack, RN Outcome: Progressing 05/29/2020 0855 by Sheela Stack, RN Outcome: Progressing Goal: Knowledge of patient specific risk factors addressed and post discharge goals established will improve 05/29/2020 1723 by Sheela Stack, RN Outcome: Adequate for Discharge 05/29/2020 0930 by Sheela Stack, RN Outcome: Progressing 05/29/2020 0855 by Sheela Stack, RN Outcome: Progressing Goal: Individualized Educational Video(s) 05/29/2020 1723 by Sheela Stack, RN Outcome: Adequate for Discharge 05/29/2020 0930 by Sheela Stack, RN Outcome: Progressing 05/29/2020 0855 by Sheela Stack, RN Outcome: Progressing   Problem: Coping: Goal: Will verbalize positive feelings about self 05/29/2020 1723 by Sheela Stack, RN Outcome: Adequate for Discharge 05/29/2020 0930 by Sheela Stack, RN Outcome: Progressing 05/29/2020 0855 by Sheela Stack, RN Outcome: Progressing Goal: Will identify appropriate support needs 05/29/2020 1723 by Sheela Stack, RN Outcome: Adequate for Discharge 05/29/2020 0930 by Sheela Stack, RN Outcome: Progressing 05/29/2020 0855 by Sheela Stack, RN Outcome: Progressing   Problem: Health Behavior/Discharge Planning: Goal: Ability to manage health-related needs will improve 05/29/2020 1723 by Sheela Stack, RN Outcome: Adequate for Discharge 05/29/2020 0930 by Sheela Stack,  RN Outcome: Progressing 05/29/2020 0855 by Sheela Stack, RN Outcome: Progressing   Problem: Self-Care: Goal: Ability to participate in self-care as condition permits will improve 05/29/2020 1723 by Sheela Stack, RN Outcome: Adequate for Discharge 05/29/2020 0930 by Sheela Stack, RN Outcome: Progressing 05/29/2020 0855 by Sheela Stack, RN Outcome: Progressing   Problem: Ischemic Stroke/TIA Tissue Perfusion: Goal: Complications of ischemic stroke/TIA will be minimized 05/29/2020 1723 by Sheela Stack, RN Outcome: Adequate for Discharge 05/29/2020 0930 by Sheela Stack, RN Outcome: Progressing 05/29/2020 0855 by Sheela Stack, RN Outcome: Progressing

## 2020-05-29 NOTE — Procedures (Incomplete)
Patient Name: [name]  MRN: [id number]  Epilepsy Attending: Charlsie Quest  Referring Physician/Provider: Dr. Vassie Loll Date: 05/29/2020 Duration:   Patient history: 87 year old female with an episode of confusion.  EEG to evaluate for seizures.  Level of alertness: Awake, drowsy, sleep, comatose, lethargic  AEDs during EEG study: None  Technical aspects: This EEG study was done with scalp electrodes positioned according to the 10-20 International system of electrode placement. Electrical activity was acquired at a sampling rate of 500Hz  and reviewed with a high frequency filter of 70Hz  and a low frequency filter of 1Hz . EEG data were recorded continuously and digitally stored.   Description: The posterior dominant rhythm consists of 9-10 Hz activity of moderate voltage (25-35 uV) seen predominantly in posterior head regions, symmetric and reactive to eye opening and eye closing. Drowsiness was characterized by attenuation of the posterior background rhythm. Sleep was characterized by vertex waves, sleep spindles (12 to 14 Hz), maximal frontocentral region.  There is an excessive amount of 15 to 18 Hz, 2-3 uV beta activity with irregular morphology distributed symmetrically and diffusely.   EEG showed continuous/intermittent generalized 3 to 6 Hz theta-delta slowing.  Hyperventilation did not show any EEG change.  Physiology photic driving was not seen during photic stimulation.  Hyperventilation and photic stimulation were not performed.     ABNORMALITY -Excessive beta, generalized -Intermittent slow, generalized -Continued slow, generalized -Triphasic waves, generalized -Sharp wave, -Spike, -Periodic epileptiform discharges, generalized/lateralized   IMPRESSION: This study is within normal limits.  The study suggestive of mild/moderate/severe diffuse encephalopathy, nonspecific etiology but likely related to sedation, toxic-metabolic etiology, anoxic/hypoxic brain  injury This study showed evidence of potential epileptogenicity arising from The study showed seizures arising from No seizures or epileptiform discharges were seen throughout the recording. However, only wakefulness and drowsiness were recorded. If suspicion for interictal activity remains a concern, a prolonged study including sleep should be considered.  The excessive beta activity seen in the background is most likely due to the effect of benzodiazepine and is a benign EEG pattern.   Kamee Bobst 

## 2020-05-29 NOTE — Discharge Summary (Signed)
Physician Discharge Summary  HARBOUR CONVEY XYI:[id number] DOB: Oct 13, 1925 DOA: 05/28/2020  PCP: Elfredia Nevins, MD  Admit date: 05/28/2020 Discharge date: 05/29/2020  Time spent: 35 minutes  Recommendations for Outpatient Follow-up:  1. Repeat basic metabolic panel to evaluate lites and renal function 2. Reassess blood pressure and adjust antihypertensive regimen as needed 3. Evaluation to further determine underlying presence of dementia recommended (patient's son and family questioning increase changes slightly suggesting memory deficits).   Discharge Diagnoses:  Active Problems:   Primary hypertension   TIA (transient ischemic attack)   Altered mental status   Dysarthria   Discharge Condition: Stable and improved.  Discharged home with home health services and instruction to follow-up with PCP and neurology as an outpatient.  CODE STATUS: Full code.  Diet recommendation: Heart healthy modified carbohydrate.  Filed Weights   05/28/20 1125 05/28/20 1739  Weight: 69.2 kg 70 kg    History of present illness:  [name] is a 87 y.o. female with medical history significant of asthma, hyperlipidemia, hypertension, mitral regurgitation gastroesophageal flux disease anxiety and osteoarthritis; who presented to the hospital secondary to acute episode of confusion, dysarthria and flaccid limbs.  Patient was last seen normal around 8:30 AM on 05/27/20.  By the time of my examination patient was back to baseline and not really understanding why she was in the hospital for.  Based on information/history by EDP provider, neurologist, patient's Son and EMS report, patient woke up with slurred speech at that time noted son was calling her to check on her; couple hours after that initial phone call on his follow-up he noted the patient having difficulty not only talking but also finding words.  EMS was activated.  Patient was transported to the hospital for further evaluation and  management.  On arrival was found to be drooling, confused, mumbling words and with flaccid limbs.  Appropriate action for code stroke initiated, no acute intracranial normalities appreciated on CT head.  Patient symptoms started to improve remarkably better by the time the neurologist was evaluating her. Patient denies fever, chills, dysuria, hematuria, melena, hematochezia, nausea, vomiting, chest pain, abdominal pain, headaches, blurred vision or any other complaints.  ED Course: CT scan negative for acute intracranial normalities.  Chest x-ray without acute cardiopulmonary process.  Patient symptoms in the resolving after approximately 3 to 3-1/2 hours.  No TPA given due to being out of therapeutic window.  Neurology consultation appreciated; plan is for observation placement and TIA/stroke work-up completion.  Hospital Course:  1-TIA -No more on neurologic deficits appreciated at time of discharge. -Mentation back to baseline. -B12 within normal limits, normal TSH, CT scan/MRI without acute intracranial abnormalities or findings suggesting large vessel occlusion. -2D echo without signs of emboli, no wall motion abnormalities or significant valvular disorder. -Case discussed with neurology service who recommended full dose aspirin for secondary prevention and outpatient follow-up in 4 weeks. -Home health services recommended by PT.  2-essential hypertension -Overall stable -Resume home antihypertensive agents -Heart healthy diet has been encouraged.  3-Type 2 diabetes mellitus -A1c 6.0 -Continue modified carbohydrate diet -No need for hypoglycemic agents currently.  4-hyperlipidemia -LDL 79 -Continue heart healthy diet and over-the-counter medication to further increase HDL and control triglycerides.  5-gastroesophageal flux disease -Continue PPI.  6-hyponatremia -Improved with fluid resuscitation -Patient advised to maintain adequate hydration. -Repeat basic metabolic panel  follow-up visit to assess electrolytes trend.  7-history of asthma/allergic rhinitis -No wheezing or complaints of respiratory distress currently -Resume home bronchodilators and nebulizer management.  8-history of anxiety -Continue as needed anxiolytics as previously prescribed.   Procedures:  See below for x-ray reports.   Carotid Dopplers: 1. Moderate plaque at the level of the right carotid bulb and proximal right ICA. Estimated right ICA stenosis is 50-69%. 2. Mild plaque at the level of the left carotid bulb and proximal ICA. Estimated left ICA stenosis is less than 50%.   2D echo: 1. Left ventricular ejection fraction, by estimation, is 65 to 70%. The  left ventricle has normal function. The left ventricle has no regional  wall motion abnormalities. Left ventricular diastolic parameters are  consistent with Grade I diastolic  dysfunction (impaired relaxation).  2. Right ventricular systolic function is normal. The right ventricular  size is normal. There is normal pulmonary artery systolic pressure.  3. The mitral valve is normal in structure. No evidence of mitral valve  regurgitation. No evidence of mitral stenosis.  4. The aortic valve has an indeterminant number of cusps. There is mild  calcification of the aortic valve. There is mild thickening of the aortic  valve. Aortic valve regurgitation is mild. No aortic stenosis is present.   Consultations:  Neurology  Discharge Exam: Vitals:   05/29/20 1008 05/29/20 1340  BP: (!) 148/79 (!) 155/80  Pulse: 71 79  Resp: 17 18  Temp: 97.8 F (36.6 C) 98 F (36.7 C)  SpO2: 99% 98%    General: Afebrile, no chest pain, no nausea, no vomiting, no requiring oxygen supplementation.  Mentation back to her baseline.  No discharge appreciated.  Patient denies focal motor deficits. Cardiovascular: S1 and S2, no rubs, no gallops, no JVD. Respiratory: Good air movement bilaterally; no using accessory muscles.  No  wheezing or crackles. Abdomen: Soft, nontender, distended, positive bowel sounds Extremities: No cyanosis or clubbing.  Discharge Instructions   Discharge Instructions    Diet - low sodium heart healthy   Complete by: As directed    Discharge instructions   Complete by: As directed    Follow heart healthy diet Maintain adequate hydration Arrange follow-up with PCP in 10 days Follow-up with neurology service in 4 weeks (Dr. Gerilyn Pilgrim) Take medications as prescribed.     Allergies as of 05/29/2020      Reactions   Gabapentin    Other reaction(s): Unknown   Morphine And Related    Family stated, "Morphine makes her wild and crazy"   Percocet [oxycodone-acetaminophen]    Povidone-iodine    Other reaction(s): Unknown   Prednisone       Medication List    STOP taking these medications   aspirin EC 81 MG tablet Replaced by: aspirin 325 MG tablet     TAKE these medications   acetaminophen 650 MG CR tablet Commonly known as: TYLENOL Take 650 mg by mouth every 8 (eight) hours as needed for pain or fever.   albuterol (2.5 MG/3ML) 0.083% nebulizer solution Commonly known as: PROVENTIL Take 2.5 mg by nebulization 4 (four) times daily.   alprazolam 2 MG tablet Commonly known as: XANAX Take 2 mg by mouth 3 (three) times daily as needed for sleep.   aspirin 325 MG tablet Take 1 tablet (325 mg total) by mouth daily. Start taking on: May 30, 2020 Replaces: aspirin EC 81 MG tablet   COD LIVER OIL W/VIT A & D PO Take 1 tablet by mouth daily.   fluticasone 50 MCG/ACT nasal spray Commonly known as: FLONASE Place 1 spray into the nose daily.   insulin NPH-regular Human (70-30) 100 UNIT/ML  injection Inject 25 Units into the skin 2 (two) times daily.   ipratropium 0.02 % nebulizer solution Commonly known as: ATROVENT Take 500 mcg by nebulization 4 (four) times daily.   losartan 100 MG tablet Commonly known as: COZAAR Take 100 mg by mouth daily.   metoprolol succinate  25 MG 24 hr tablet Commonly known as: TOPROL-XL Take 25 mg by mouth daily.   pantoprazole 40 MG tablet Commonly known as: PROTONIX Take 40 mg by mouth daily.   PROTEGRA PO Take 1 tablet by mouth daily.      Allergies  Allergen Reactions  . Gabapentin     Other reaction(s): Unknown  . Morphine And Related     Family stated, "Morphine makes her wild and crazy"  . Percocet [Oxycodone-Acetaminophen]   . Povidone-Iodine     Other reaction(s): Unknown  . Prednisone     Follow-up Information    Elfredia Nevins, MD. Schedule an appointment as soon as possible for a visit in 10 day(s).   Specialty: Internal Medicine Contact information: [address] [place] [address] [phone]               The results of significant diagnostics from this hospitalization (including imaging, microbiology, ancillary and laboratory) are listed below for reference.    Significant Diagnostic Studies: MR ANGIO HEAD WO CONTRAST  Result Date: 05/29/2020 CLINICAL DATA:  Provided history: Transient ischemic attack (TIA), altered mental status for 2 days. EXAM: MRI HEAD WITHOUT CONTRAST MRA HEAD WITHOUT CONTRAST TECHNIQUE: Multiplanar, multiecho pulse sequences of the brain and surrounding structures were obtained without intravenous contrast. Angiographic images of the head were obtained using MRA technique without contrast. COMPARISON:  Noncontrast head CT 05/28/2020. Report from brain MRI 11/26/2002 (images unavailable). FINDINGS: MRI HEAD FINDINGS Brain: Mild intermittent motion degradation. Moderate cerebral atrophy.  Comparatively mild cerebellar atrophy. Moderate multifocal T2/FLAIR hyperintensity within the cerebral white matter is nonspecific, but compatible with chronic small vessel ischemic disease. To a lesser degree, chronic small vessel ischemic changes are also present within the pons. Tiny chronic infarct within the right cerebellar hemisphere (series 10, image 4). There is no  acute infarct. No evidence of intracranial mass. No chronic intracranial blood products. No extra-axial fluid collection. No midline shift. Vascular: Expected proximal arterial flow voids. Skull and upper cervical spine: No focal marrow lesion. Cervical spondylosis. C4-C5 grade 1 anterolisthesis. Sinuses/Orbits: Visualized orbits show no acute finding. Bilateral lens replacements. Minimal ethmoid sinus mucosal thickening. MRA HEAD FINDINGS Mild-to-moderate motion degradation. The intracranial internal carotid arteries are patent. The M1 middle cerebral arteries are patent. Mild to moderate stenosis of the proximal M1 left MCA. Early branching of the M1 middle cerebral arteries bilaterally. Atherosclerotic irregularity of the M2 and more distal MCA branch vessels bilaterally without appreciable M2 proximal branch occlusion or high-grade proximal arterial stenosis. The anterior cerebral arteries are patent. Diminutive appearance of portions of the A1 right anterior cerebral artery. This may be secondary to developmentally small vessel size and/or high-grade stenosis. The intracranial vertebral arteries are patent. The basilar artery is patent. Mild atherosclerotic narrowing of the mid basilar artery. The posterior cerebral arteries are patent proximally without high-grade stenosis. Motion degradation precludes adequate evaluation of the distal P2 and more distal posterior cerebral arteries. No intracranial aneurysm is identified. IMPRESSION: MRI brain: 1. Mildly motion degraded exam. 2. No evidence of acute intracranial abnormality, including acute infarction. 3. Moderate cerebral atrophy and chronic small vessel ischemic disease. 4. Chronic subcentimeter infarct within the right cerebellar hemisphere. MRA head:  1. Examination limited by mild to moderate motion degradation, as described. 2. Portions of the A1 right anterior cerebral artery are diminutive in appearance. This may be secondary to developmentally small  vessel size and/or high-grade stenosis. 3. Elsewhere, no intracranial large vessel occlusion or proximal high-grade arterial stenosis is identified. 4. Mild-to-moderate stenosis of the proximal M1 left middle cerebral artery. 5. Atherosclerotic irregularity of the M2 and more distal MCA branches bilaterally. 6. Mild atherosclerotic narrowing of the mid basilar artery. Electronically Signed   By: Jackey Loge DO   On: 05/29/2020 12:29   MR BRAIN WO CONTRAST  Result Date: 05/29/2020 CLINICAL DATA:  Provided history: Transient ischemic attack (TIA), altered mental status for 2 days. EXAM: MRI HEAD WITHOUT CONTRAST MRA HEAD WITHOUT CONTRAST TECHNIQUE: Multiplanar, multiecho pulse sequences of the brain and surrounding structures were obtained without intravenous contrast. Angiographic images of the head were obtained using MRA technique without contrast. COMPARISON:  Noncontrast head CT 05/28/2020. Report from brain MRI 11/26/2002 (images unavailable). FINDINGS: MRI HEAD FINDINGS Brain: Mild intermittent motion degradation. Moderate cerebral atrophy.  Comparatively mild cerebellar atrophy. Moderate multifocal T2/FLAIR hyperintensity within the cerebral white matter is nonspecific, but compatible with chronic small vessel ischemic disease. To a lesser degree, chronic small vessel ischemic changes are also present within the pons. Tiny chronic infarct within the right cerebellar hemisphere (series 10, image 4). There is no acute infarct. No evidence of intracranial mass. No chronic intracranial blood products. No extra-axial fluid collection. No midline shift. Vascular: Expected proximal arterial flow voids. Skull and upper cervical spine: No focal marrow lesion. Cervical spondylosis. C4-C5 grade 1 anterolisthesis. Sinuses/Orbits: Visualized orbits show no acute finding. Bilateral lens replacements. Minimal ethmoid sinus mucosal thickening. MRA HEAD FINDINGS Mild-to-moderate motion degradation. The intracranial  internal carotid arteries are patent. The M1 middle cerebral arteries are patent. Mild to moderate stenosis of the proximal M1 left MCA. Early branching of the M1 middle cerebral arteries bilaterally. Atherosclerotic irregularity of the M2 and more distal MCA branch vessels bilaterally without appreciable M2 proximal branch occlusion or high-grade proximal arterial stenosis. The anterior cerebral arteries are patent. Diminutive appearance of portions of the A1 right anterior cerebral artery. This may be secondary to developmentally small vessel size and/or high-grade stenosis. The intracranial vertebral arteries are patent. The basilar artery is patent. Mild atherosclerotic narrowing of the mid basilar artery. The posterior cerebral arteries are patent proximally without high-grade stenosis. Motion degradation precludes adequate evaluation of the distal P2 and more distal posterior cerebral arteries. No intracranial aneurysm is identified. IMPRESSION: MRI brain: 1. Mildly motion degraded exam. 2. No evidence of acute intracranial abnormality, including acute infarction. 3. Moderate cerebral atrophy and chronic small vessel ischemic disease. 4. Chronic subcentimeter infarct within the right cerebellar hemisphere. MRA head: 1. Examination limited by mild to moderate motion degradation, as described. 2. Portions of the A1 right anterior cerebral artery are diminutive in appearance. This may be secondary to developmentally small vessel size and/or high-grade stenosis. 3. Elsewhere, no intracranial large vessel occlusion or proximal high-grade arterial stenosis is identified. 4. Mild-to-moderate stenosis of the proximal M1 left middle cerebral artery. 5. Atherosclerotic irregularity of the M2 and more distal MCA branches bilaterally. 6. Mild atherosclerotic narrowing of the mid basilar artery. Electronically Signed   By: Jackey Loge DO   On: 05/29/2020 12:29   US Carotid Bilateral (at [facility] and [facility] only)  Result Date:  05/29/2020 CLINICAL DATA:  TIA, hypertension and hyperlipidemia. EXAM: BILATERAL CAROTID DUPLEX ULTRASOUND TECHNIQUE: Wallace Cullens scale imaging,  color Doppler and duplex ultrasound were performed of bilateral carotid and vertebral arteries in the neck. COMPARISON:  None. FINDINGS: Criteria: Quantification of carotid stenosis is based on velocity parameters that correlate the residual internal carotid diameter with NASCET-based stenosis levels, using the diameter of the distal internal carotid lumen as the denominator for stenosis measurement. The following velocity measurements were obtained: RIGHT ICA:  195/14 cm/sec CCA:  68/6 cm/sec SYSTOLIC ICA/CCA RATIO:  2.9 ECA:  130 cm/sec LEFT ICA:  92/16 cm/sec CCA:  67/9 cm/sec SYSTOLIC ICA/CCA RATIO:  1.4 ECA:  91 cm/sec RIGHT CAROTID ARTERY: Moderate calcified plaque present at the level of the right carotid bulb and proximal ICA. Estimated right ICA stenosis is 50-69%. The right ICA is moderately tortuous. RIGHT VERTEBRAL ARTERY: Antegrade flow with normal waveform and velocity. LEFT CAROTID ARTERY: Mild amount of partially calcified plaque is present at the level of the carotid bulb and proximal left ICA. Estimated left ICA stenosis is less than 50%. LEFT VERTEBRAL ARTERY: Antegrade flow with normal waveform and velocity. IMPRESSION: 1. Moderate plaque at the level of the right carotid bulb and proximal right ICA. Estimated right ICA stenosis is 50-69%. 2. Mild plaque at the level of the left carotid bulb and proximal ICA. Estimated left ICA stenosis is less than 50%. Electronically Signed   By: Irish Lack M.D.   On: 05/29/2020 11:44   DG Chest Port 1 View  Result Date: 05/28/2020 CLINICAL DATA:  Possible aspiration. EXAM: PORTABLE CHEST 1 VIEW COMPARISON:  May 08, 2018. FINDINGS: The heart size and mediastinal contours are within normal limits. Both lungs are clear. The visualized skeletal structures are unremarkable. IMPRESSION: No active disease. Aortic  Atherosclerosis (ICD10-I70.0). Electronically Signed   By: Lupita Raider M.D.   On: 05/28/2020 11:55   ECHOCARDIOGRAM COMPLETE  Result Date: 05/29/2020    ECHOCARDIOGRAM REPORT   Patient Name:   RAYVN RICKERSON Date of Exam: 05/29/2020 Medical Rec #:  [id number]          Height:       60.0 in Accession #:    [id number]         Weight:       154.3 lb Date of Birth:  March 19, 1926          BSA:          1.672 m Patient Age:    87 years           BP:           134/68 mmHg Patient Gender: F                  HR:           72 bpm. [facility] Procedure: 2D Echo, Cardiac Doppler and Color Doppler Indications:    TIA  History:        Patient has no prior history of Echocardiogram examinations.                 CAD, Stroke, Signs/Symptoms:Altered Mental Status; Risk                 Factors:Hypertension, Diabetes and Dyslipidemia. Anxiety.  Sonographer:    Lavenia Atlas RDCS Referring Phys: [phone] Eagan Shifflett  Sonographer Comments: Patient very anxious and talkitive and Image acquisition challenging due to respiratory motion. IMPRESSIONS  1. Left ventricular ejection fraction, by estimation, is 65 to 70%. The left ventricle has normal function. The left ventricle has no regional wall  motion abnormalities. Left ventricular diastolic parameters are consistent with Grade I diastolic dysfunction (impaired relaxation).  2. Right ventricular systolic function is normal. The right ventricular size is normal. There is normal pulmonary artery systolic pressure.  3. The mitral valve is normal in structure. No evidence of mitral valve regurgitation. No evidence of mitral stenosis.  4. The aortic valve has an indeterminant number of cusps. There is mild calcification of the aortic valve. There is mild thickening of the aortic valve. Aortic valve regurgitation is mild. No aortic stenosis is present. FINDINGS  Left Ventricle: Left ventricular ejection fraction, by estimation, is 65 to 70%. The left ventricle has normal  function. The left ventricle has no regional wall motion abnormalities. The left ventricular internal cavity size was normal in size. There is  no left ventricular hypertrophy. Left ventricular diastolic parameters are consistent with Grade I diastolic dysfunction (impaired relaxation). Normal left ventricular filling pressure. Right Ventricle: The right ventricular size is normal. No increase in right ventricular wall thickness. Right ventricular systolic function is normal. There is normal pulmonary artery systolic pressure. The tricuspid regurgitant velocity is 2.47 m/s, and  with an assumed right atrial pressure of 3 mmHg, the estimated right ventricular systolic pressure is 27.4 mmHg. Left Atrium: Left atrial size was normal in size. Right Atrium: Right atrial size was normal in size. Pericardium: There is no evidence of pericardial effusion. Mitral Valve: The mitral valve is normal in structure. No evidence of mitral valve regurgitation. No evidence of mitral valve stenosis. Tricuspid Valve: The tricuspid valve is normal in structure. Tricuspid valve regurgitation is trivial. No evidence of tricuspid stenosis. Aortic Valve: The aortic valve has an indeterminant number of cusps. There is mild calcification of the aortic valve. There is mild thickening of the aortic valve. There is mild aortic valve annular calcification. Aortic valve regurgitation is mild. No aortic stenosis is present. Aortic valve mean gradient measures 6.0 mmHg. Aortic valve peak gradient measures 13.4 mmHg. Aortic valve area, by VTI measures 1.17 cm. Pulmonic Valve: The pulmonic valve was not well visualized. Pulmonic valve regurgitation is not visualized. No evidence of pulmonic stenosis. Aorta: The aortic root is normal in size and structure. Pulmonary Artery: Indeterminant PASP, inadequate TR jet. Venous: The inferior vena cava was not well visualized. IAS/Shunts: The interatrial septum was not well visualized.  LEFT VENTRICLE PLAX 2D  LVIDd:         4.12 cm  Diastology LVIDs:         2.26 cm  LV e' medial:    5.33 cm/s LV PW:         1.05 cm  LV E/e' medial:  12.5 LV IVS:        0.93 cm  LV e' lateral:   3.92 cm/s LVOT diam:     2.10 cm  LV E/e' lateral: 17.0 LV SV:         45 LV SV Index:   27 LVOT Area:     3.46 cm  RIGHT VENTRICLE RV Basal diam:  2.84 cm RV S prime:     9.25 cm/s LEFT ATRIUM             Index       RIGHT ATRIUM          Index LA diam:        3.40 cm 2.03 cm/m  RA Area:     9.70 cm LA Vol (A2C):   25.1 ml 15.01 ml/m RA Volume:   21.50  ml 12.86 ml/m LA Vol (A4C):   24.4 ml 14.59 ml/m LA Biplane Vol: 24.9 ml 14.89 ml/m  AORTIC VALVE AV Area (Vmax):    1.31 cm AV Area (Vmean):   1.26 cm AV Area (VTI):     1.17 cm AV Vmax:           183.16 cm/s AV Vmean:          112.725 cm/s AV VTI:            0.385 m AV Peak Grad:      13.4 mmHg AV Mean Grad:      6.0 mmHg LVOT Vmax:         69.50 cm/s LVOT Vmean:        41.100 cm/s LVOT VTI:          0.130 m LVOT/AV VTI ratio: 0.34  AORTA Ao Root diam: 2.80 cm MITRAL VALVE               TRICUSPID VALVE MV Area (PHT): 6.12 cm    TR Peak grad:   24.4 mmHg MV Decel Time: 124 msec    TR Vmax:        247.00 cm/s MV E velocity: 66.50 cm/s MV A velocity: 89.20 cm/s  SHUNTS MV E/A ratio:  0.75        Systemic VTI:  0.13 m                            Systemic Diam: 2.10 cm Dina RichJonathan Branch MD Electronically signed by Dina RichJonathan Branch MD Signature Date/Time: 05/29/2020/10:21:11 AM    Final    CT HEAD CODE STROKE WO CONTRAST  Addendum Date: 05/28/2020   ADDENDUM REPORT: 05/28/2020 11:49 ADDENDUM: Study discussed by telephone with Dr. Vanetta MuldersSCOTT ZACKOWSKI on 05/28/2020 at 1128 hours. Electronically Signed   By: Odessa FlemingH  Hall M.D.   On: 05/28/2020 11:49   Result Date: 05/28/2020 CLINICAL DATA:  Code stroke.  87 year old female. EXAM: CT HEAD WITHOUT CONTRAST TECHNIQUE: Contiguous axial images were obtained from the base of the skull through the vertex without intravenous contrast. COMPARISON:  Head CT  04/06/2004. FINDINGS: Brain: Cerebral volume does not appear significantly changed since 2005 and is normal for age. No midline shift, ventriculomegaly, mass effect, evidence of mass lesion, intracranial hemorrhage or evidence of cortically based acute infarction. Scattered chronic patchy white matter hypodensity, with progression at the left anterior deep white matter capsule since 2005. Otherwise preserved gray-white matter differentiation. No cortical encephalomalacia identified. Vascular: Calcified atherosclerosis at the skull base. No suspicious intracranial vascular hyperdensity. Skull: No acute osseous abnormality identified. Sinuses/Orbits: Visualized paranasal sinuses and mastoids are stable and well pneumatized. Other: Stable and negative orbit and scalp soft tissues. ASPECTS [facility]([place] [facility] Early CT Score) Total score (0-10 with 10 being normal): 10 IMPRESSION: 1. No acute cortically based infarct or acute intracranial hemorrhage identified. ASPECTS 10. 2. Chronic white matter disease appears with mild progression since 2005. Electronically Signed: By: Odessa FlemingH  Hall M.D. On: 05/28/2020 11:27    Microbiology: Recent Results (from the past 240 hour(s))  SARS Coronavirus 2 by RT PCR (hospital order, [facility]) Nasopharyngeal Nasopharyngeal Swab     Status: None   Collection Time: 05/28/20 11:18 AM   Specimen: Nasopharyngeal Swab  Result Value Ref Range Status   SARS Coronavirus 2 NEGATIVE NEGATIVE Final    Comment: (NOTE) SARS-CoV-2 target nucleic acids are NOT DETECTED.  The SARS-CoV-2 RNA  is generally detectable in upper and lower respiratory specimens during the acute phase of infection. The lowest concentration of SARS-CoV-2 viral copies this assay can detect is 250 copies / mL. A negative result does not preclude SARS-CoV-2 infection and should not be used as the sole basis for treatment or other patient management decisions.  A negative result may occur  with improper specimen collection / handling, submission of specimen other than nasopharyngeal swab, presence of viral mutation(s) within the areas targeted by this assay, and inadequate number of viral copies (<250 copies / mL). A negative result must be combined with clinical observations, patient history, and epidemiological information.  Fact Sheet for Patients:   BoilerBrush.com.cy  Fact Sheet for Healthcare Providers: https://pope.com/  This test is not yet approved or  cleared by the [place] FDA and has been authorized for detection and/or diagnosis of SARS-CoV-2 by FDA under an Emergency Use Authorization (EUA).  This EUA will remain in effect (meaning this test can be used) for the duration of the COVID-19 declaration under Section 564(b)(1) of the Act, 21 U.S.C. section 360bbb-3(b)(1), unless the authorization is terminated or revoked sooner.  Performed at [facility], [address], [place], [place] [address]      Labs: Basic Metabolic Panel: Recent Labs  Lab 05/28/20 1125 05/28/20 1146  NA 131* 132*  K 4.6 4.9  CL 97* 95*  CO2 26  --   GLUCOSE 198* 190*  BUN 9 10  CREATININE 0.67 0.70  CALCIUM 9.0  --    Liver Function Tests: Recent Labs  Lab 05/28/20 1125  AST 20  ALT 15  ALKPHOS 41  BILITOT 1.2  PROT 6.7  ALBUMIN 3.9   CBC: Recent Labs  Lab 05/28/20 1125 05/28/20 1146  WBC 7.5  --   NEUTROABS 3.1  --   HGB 13.1 14.6  HCT 38.9 43.0  MCV 103.7*  --   PLT 210  --    CBG: Recent Labs  Lab 05/28/20 1811 05/28/20 2006 05/29/20 0730 05/29/20 1152 05/29/20 1627  GLUCAP 90 93 140* 199* 198*    Signed:  Vassie Loll MD.  [facility] 05/29/2020, 5:04 PM

## 2020-05-30 LAB — GLUCOSE, CAPILLARY: Glucose-Capillary: 167 mg/dL — ABNORMAL HIGH (ref 70–99)

## 2020-05-30 NOTE — Progress Notes (Signed)
Pt scheduled for discharge, EMS arrived and needed to verify someone would be home to receive the pt. Contacted son who resides behind the pt, and he stated he has Covid and unable to receive the pt tonight. EMS will not transport without someone present. MD notified of discharge postponed until tomorrow morning.

## 2020-08-13 ENCOUNTER — Emergency Department (HOSPITAL_COMMUNITY): Payer: Medicare Other

## 2020-08-13 ENCOUNTER — Encounter (HOSPITAL_COMMUNITY): Payer: Self-pay | Admitting: Emergency Medicine

## 2020-08-13 ENCOUNTER — Other Ambulatory Visit: Payer: Self-pay

## 2020-08-13 ENCOUNTER — Inpatient Hospital Stay (HOSPITAL_COMMUNITY)
Admission: EM | Admit: 2020-08-13 | Discharge: 2020-08-16 | DRG: 871 | Disposition: A | Discharge status: Hospice | Payer: Medicare Other | Attending: Internal Medicine | Admitting: Internal Medicine

## 2020-08-13 DIAGNOSIS — E785 Hyperlipidemia, unspecified: Secondary | ICD-10-CM | POA: Diagnosis present

## 2020-08-13 DIAGNOSIS — G9341 Metabolic encephalopathy: Secondary | ICD-10-CM | POA: Diagnosis present

## 2020-08-13 DIAGNOSIS — A419 Sepsis, unspecified organism: Secondary | ICD-10-CM | POA: Diagnosis present

## 2020-08-13 DIAGNOSIS — R54 Age-related physical debility: Secondary | ICD-10-CM | POA: Diagnosis present

## 2020-08-13 DIAGNOSIS — Z885 Allergy status to narcotic agent status: Secondary | ICD-10-CM

## 2020-08-13 DIAGNOSIS — Z6830 Body mass index (BMI) 30.0-30.9, adult: Secondary | ICD-10-CM

## 2020-08-13 DIAGNOSIS — F419 Anxiety disorder, unspecified: Secondary | ICD-10-CM | POA: Diagnosis present

## 2020-08-13 DIAGNOSIS — G3184 Mild cognitive impairment, so stated: Secondary | ICD-10-CM | POA: Diagnosis present

## 2020-08-13 DIAGNOSIS — I2510 Atherosclerotic heart disease of native coronary artery without angina pectoris: Secondary | ICD-10-CM | POA: Diagnosis present

## 2020-08-13 DIAGNOSIS — E119 Type 2 diabetes mellitus without complications: Secondary | ICD-10-CM | POA: Diagnosis present

## 2020-08-13 DIAGNOSIS — Z9049 Acquired absence of other specified parts of digestive tract: Secondary | ICD-10-CM

## 2020-08-13 DIAGNOSIS — J90 Pleural effusion, not elsewhere classified: Secondary | ICD-10-CM

## 2020-08-13 DIAGNOSIS — Z8673 Personal history of transient ischemic attack (TIA), and cerebral infarction without residual deficits: Secondary | ICD-10-CM

## 2020-08-13 DIAGNOSIS — J9811 Atelectasis: Secondary | ICD-10-CM | POA: Diagnosis present

## 2020-08-13 DIAGNOSIS — Z515 Encounter for palliative care: Secondary | ICD-10-CM

## 2020-08-13 DIAGNOSIS — I340 Nonrheumatic mitral (valve) insufficiency: Secondary | ICD-10-CM | POA: Diagnosis present

## 2020-08-13 DIAGNOSIS — J9601 Acute respiratory failure with hypoxia: Secondary | ICD-10-CM | POA: Diagnosis present

## 2020-08-13 DIAGNOSIS — R471 Dysarthria and anarthria: Secondary | ICD-10-CM | POA: Diagnosis present

## 2020-08-13 DIAGNOSIS — Z66 Do not resuscitate: Secondary | ICD-10-CM | POA: Diagnosis present

## 2020-08-13 DIAGNOSIS — M1990 Unspecified osteoarthritis, unspecified site: Secondary | ICD-10-CM | POA: Diagnosis present

## 2020-08-13 DIAGNOSIS — Z87891 Personal history of nicotine dependence: Secondary | ICD-10-CM

## 2020-08-13 DIAGNOSIS — E669 Obesity, unspecified: Secondary | ICD-10-CM | POA: Diagnosis present

## 2020-08-13 DIAGNOSIS — I480 Paroxysmal atrial fibrillation: Secondary | ICD-10-CM | POA: Diagnosis not present

## 2020-08-13 DIAGNOSIS — Z90711 Acquired absence of uterus with remaining cervical stump: Secondary | ICD-10-CM

## 2020-08-13 DIAGNOSIS — Z794 Long term (current) use of insulin: Secondary | ICD-10-CM

## 2020-08-13 DIAGNOSIS — Z20822 Contact with and (suspected) exposure to covid-19: Secondary | ICD-10-CM | POA: Diagnosis present

## 2020-08-13 DIAGNOSIS — K219 Gastro-esophageal reflux disease without esophagitis: Secondary | ICD-10-CM | POA: Diagnosis present

## 2020-08-13 DIAGNOSIS — J869 Pyothorax without fistula: Secondary | ICD-10-CM | POA: Diagnosis present

## 2020-08-13 DIAGNOSIS — Z8249 Family history of ischemic heart disease and other diseases of the circulatory system: Secondary | ICD-10-CM

## 2020-08-13 DIAGNOSIS — Z79899 Other long term (current) drug therapy: Secondary | ICD-10-CM

## 2020-08-13 DIAGNOSIS — R296 Repeated falls: Secondary | ICD-10-CM | POA: Diagnosis present

## 2020-08-13 DIAGNOSIS — H9190 Unspecified hearing loss, unspecified ear: Secondary | ICD-10-CM | POA: Diagnosis present

## 2020-08-13 DIAGNOSIS — Z28310 Unvaccinated for covid-19: Secondary | ICD-10-CM

## 2020-08-13 DIAGNOSIS — I10 Essential (primary) hypertension: Secondary | ICD-10-CM | POA: Diagnosis present

## 2020-08-13 DIAGNOSIS — Z9181 History of falling: Secondary | ICD-10-CM

## 2020-08-13 DIAGNOSIS — Z7982 Long term (current) use of aspirin: Secondary | ICD-10-CM

## 2020-08-13 DIAGNOSIS — J309 Allergic rhinitis, unspecified: Secondary | ICD-10-CM | POA: Diagnosis present

## 2020-08-13 DIAGNOSIS — Z888 Allergy status to other drugs, medicaments and biological substances status: Secondary | ICD-10-CM

## 2020-08-13 DIAGNOSIS — Z7189 Other specified counseling: Secondary | ICD-10-CM | POA: Diagnosis not present

## 2020-08-13 LAB — URINALYSIS, ROUTINE W REFLEX MICROSCOPIC
Bacteria, UA: NONE SEEN
Bilirubin Urine: NEGATIVE
Glucose, UA: 50 mg/dL — AB
Hgb urine dipstick: NEGATIVE
Ketones, ur: 5 mg/dL — AB
Nitrite: NEGATIVE
Protein, ur: 30 mg/dL — AB
Specific Gravity, Urine: >1.046 — ABNORMAL HIGH (ref 1.005–1.030)
WBC, UA: >50 WBC/hpf — ABNORMAL HIGH (ref 0–5)
pH: 6 (ref 5.0–8.0)

## 2020-08-13 LAB — CBC WITH DIFFERENTIAL/PLATELET
Band Neutrophils: 1 %
Basophils Absolute: 0 10*3/uL (ref 0.0–0.1)
Basophils Relative: 0 %
Eosinophils Absolute: 0 10*3/uL (ref 0.0–0.5)
Eosinophils Relative: 0 %
HCT: 40.7 % (ref 36.0–46.0)
Hemoglobin: 13.2 g/dL (ref 12.0–15.0)
Lymphocytes Relative: 10 %
Lymphs Abs: 2.8 10*3/uL (ref 0.7–4.0)
MCH: 34.4 pg — ABNORMAL HIGH (ref 26.0–34.0)
MCHC: 32.4 g/dL (ref 30.0–36.0)
MCV: 106 fL — ABNORMAL HIGH (ref 80.0–100.0)
Monocytes Absolute: 0.3 10*3/uL (ref 0.1–1.0)
Monocytes Relative: 1 %
Neutro Abs: 24.8 10*3/uL — ABNORMAL HIGH (ref 1.7–7.7)
Neutrophils Relative %: 88 %
Platelets: 321 10*3/uL (ref 150–400)
RBC: 3.84 MIL/uL — ABNORMAL LOW (ref 3.87–5.11)
RDW: 12.1 % (ref 11.5–15.5)
WBC: 27.9 10*3/uL — ABNORMAL HIGH (ref 4.0–10.5)
nRBC: 0 % (ref 0.0–0.2)

## 2020-08-13 LAB — GLUCOSE, CAPILLARY
Glucose-Capillary: 211 mg/dL — ABNORMAL HIGH (ref 70–99)
Glucose-Capillary: 205 mg/dL — ABNORMAL HIGH (ref 70–99)

## 2020-08-13 LAB — TROPONIN I (HIGH SENSITIVITY)
Troponin I (High Sensitivity): 23 ng/L — ABNORMAL HIGH (ref <18)
Troponin I (High Sensitivity): 25 ng/L — ABNORMAL HIGH (ref <18)

## 2020-08-13 LAB — RESP PANEL BY RT-PCR (FLU A&B, COVID) ARPGX2
Influenza A by PCR: NEGATIVE
Influenza B by PCR: NEGATIVE
SARS Coronavirus 2 by RT PCR: NEGATIVE

## 2020-08-13 LAB — COMPREHENSIVE METABOLIC PANEL
ALT: 55 U/L — ABNORMAL HIGH (ref 0–44)
AST: 60 U/L — ABNORMAL HIGH (ref 15–41)
Albumin: 2.9 g/dL — ABNORMAL LOW (ref 3.5–5.0)
Alkaline Phosphatase: 128 U/L — ABNORMAL HIGH (ref 38–126)
Anion gap: 13 (ref 5–15)
BUN: 22 mg/dL (ref 8–23)
CO2: 29 mmol/L (ref 22–32)
Calcium: 8.8 mg/dL — ABNORMAL LOW (ref 8.9–10.3)
Chloride: 96 mmol/L — ABNORMAL LOW (ref 98–111)
Creatinine, Ser: 0.83 mg/dL (ref 0.44–1.00)
GFR, Estimated: >60 mL/min (ref >60)
Glucose, Bld: 254 mg/dL — ABNORMAL HIGH (ref 70–99)
Potassium: 3.6 mmol/L (ref 3.5–5.1)
Sodium: 138 mmol/L (ref 135–145)
Total Bilirubin: 1.7 mg/dL — ABNORMAL HIGH (ref 0.3–1.2)
Total Protein: 7 g/dL (ref 6.5–8.1)

## 2020-08-13 LAB — LACTIC ACID, PLASMA: Lactic Acid, Venous: 1.7 mmol/L (ref 0.5–1.9)

## 2020-08-13 LAB — LIPASE, BLOOD: Lipase: 24 U/L (ref 11–51)

## 2020-08-13 LAB — MRSA PCR SCREENING: MRSA by PCR: NEGATIVE

## 2020-08-13 LAB — HEMOGLOBIN A1C
Hgb A1c MFr Bld: 6.6 % — ABNORMAL HIGH (ref 4.8–5.6)
Mean Plasma Glucose: 142.72 mg/dL

## 2020-08-13 LAB — STREP PNEUMONIAE URINARY ANTIGEN: Strep Pneumo Urinary Antigen: NEGATIVE

## 2020-08-13 MED ORDER — LACTATED RINGERS IV BOLUS (SEPSIS)
1000.0000 mL | Freq: Once | INTRAVENOUS | Status: AC
  Administered 2020-08-13: 1000 mL via INTRAVENOUS

## 2020-08-13 MED ORDER — IPRATROPIUM-ALBUTEROL 0.5-2.5 (3) MG/3ML IN SOLN
3.0000 mL | Freq: Four times a day (QID) | RESPIRATORY_TRACT | Status: DC | PRN
  Administered 2020-08-15: 3 mL via RESPIRATORY_TRACT
  Filled 2020-08-13: qty 3

## 2020-08-13 MED ORDER — ASPIRIN 325 MG PO TABS
325.0000 mg | ORAL_TABLET | Freq: Every day | ORAL | Status: DC
  Administered 2020-08-14 – 2020-08-16 (×3): 325 mg via ORAL
  Filled 2020-08-13 (×4): qty 1

## 2020-08-13 MED ORDER — ONDANSETRON HCL 4 MG/2ML IJ SOLN: 4.0000 mg | Freq: Four times a day (QID) | INTRAMUSCULAR | Status: DC | PRN

## 2020-08-13 MED ORDER — LACTATED RINGERS IV SOLN: INTRAVENOUS | Status: DC

## 2020-08-13 MED ORDER — IOHEXOL 350 MG/ML SOLN
100.0000 mL | Freq: Once | INTRAVENOUS | Status: AC | PRN
  Administered 2020-08-13: 100 mL via INTRAVENOUS

## 2020-08-13 MED ORDER — LABETALOL HCL 5 MG/ML IV SOLN: 10.0000 mg | INTRAVENOUS | Status: DC | PRN

## 2020-08-13 MED ORDER — INSULIN ASPART 100 UNIT/ML ~~LOC~~ SOLN
0.0000 [IU] | Freq: Every day | SUBCUTANEOUS | Status: DC
  Administered 2020-08-13 – 2020-08-15 (×3): 2 [IU] via SUBCUTANEOUS

## 2020-08-13 MED ORDER — LACTATED RINGERS IV BOLUS (SEPSIS)
250.0000 mL | Freq: Once | INTRAVENOUS | Status: AC
  Administered 2020-08-13: 250 mL via INTRAVENOUS

## 2020-08-13 MED ORDER — FLUTICASONE PROPIONATE 50 MCG/ACT NA SUSP
1.0000 | Freq: Every day | NASAL | Status: DC
  Administered 2020-08-13 – 2020-08-16 (×4): 1 via NASAL
  Filled 2020-08-13: qty 16

## 2020-08-13 MED ORDER — ONDANSETRON HCL 4 MG PO TABS: 4.0000 mg | ORAL_TABLET | Freq: Four times a day (QID) | ORAL | Status: DC | PRN

## 2020-08-13 MED ORDER — INSULIN ASPART 100 UNIT/ML ~~LOC~~ SOLN
0.0000 [IU] | Freq: Three times a day (TID) | SUBCUTANEOUS | Status: DC
  Administered 2020-08-13: 3 [IU] via SUBCUTANEOUS
  Administered 2020-08-14: 7 [IU] via SUBCUTANEOUS
  Administered 2020-08-14: 3 [IU] via SUBCUTANEOUS
  Administered 2020-08-14: 2 [IU] via SUBCUTANEOUS
  Administered 2020-08-15 – 2020-08-16 (×4): 3 [IU] via SUBCUTANEOUS
  Administered 2020-08-16: 2 [IU] via SUBCUTANEOUS

## 2020-08-13 MED ORDER — VANCOMYCIN HCL IN DEXTROSE 1-5 GM/200ML-% IV SOLN
1000.0000 mg | Freq: Once | INTRAVENOUS | Status: AC
  Administered 2020-08-13: 1000 mg via INTRAVENOUS
  Filled 2020-08-13: qty 200

## 2020-08-13 MED ORDER — VANCOMYCIN HCL IN DEXTROSE 1-5 GM/200ML-% IV SOLN
1000.0000 mg | INTRAVENOUS | Status: DC
  Administered 2020-08-14 – 2020-08-15 (×2): 1000 mg via INTRAVENOUS
  Filled 2020-08-13 (×2): qty 200

## 2020-08-13 MED ORDER — PANTOPRAZOLE SODIUM 40 MG PO TBEC
40.0000 mg | DELAYED_RELEASE_TABLET | Freq: Every day | ORAL | Status: DC
  Administered 2020-08-13 – 2020-08-16 (×4): 40 mg via ORAL
  Filled 2020-08-13 (×4): qty 1

## 2020-08-13 MED ORDER — SODIUM CHLORIDE 0.9 % IV SOLN
2.0000 g | Freq: Two times a day (BID) | INTRAVENOUS | Status: DC
  Administered 2020-08-14 – 2020-08-16 (×5): 2 g via INTRAVENOUS
  Filled 2020-08-13 (×6): qty 2

## 2020-08-13 MED ORDER — SODIUM CHLORIDE 0.9 % IV SOLN: INTRAVENOUS | Status: DC

## 2020-08-13 MED ORDER — ACETAMINOPHEN 325 MG PO TABS
650.0000 mg | ORAL_TABLET | Freq: Four times a day (QID) | ORAL | Status: DC | PRN
  Administered 2020-08-13 – 2020-08-16 (×5): 650 mg via ORAL
  Filled 2020-08-13 (×5): qty 2

## 2020-08-13 MED ORDER — SODIUM CHLORIDE 0.9 % IV SOLN
2.0000 g | Freq: Once | INTRAVENOUS | Status: AC
  Administered 2020-08-13: 2 g via INTRAVENOUS
  Filled 2020-08-13: qty 2

## 2020-08-13 MED ORDER — ALPRAZOLAM 1 MG PO TABS
2.0000 mg | ORAL_TABLET | Freq: Three times a day (TID) | ORAL | Status: DC | PRN
  Administered 2020-08-13 – 2020-08-16 (×4): 2 mg via ORAL
  Filled 2020-08-13 (×4): qty 2

## 2020-08-13 MED ORDER — ENOXAPARIN SODIUM 40 MG/0.4ML ~~LOC~~ SOLN
40.0000 mg | SUBCUTANEOUS | Status: DC
  Administered 2020-08-13 – 2020-08-15 (×3): 40 mg via SUBCUTANEOUS
  Filled 2020-08-13 (×3): qty 0.4

## 2020-08-13 MED ORDER — METRONIDAZOLE IN NACL 5-0.79 MG/ML-% IV SOLN
500.0000 mg | Freq: Once | INTRAVENOUS | Status: AC
  Administered 2020-08-13: 500 mg via INTRAVENOUS
  Filled 2020-08-13: qty 100

## 2020-08-13 MED ORDER — ACETAMINOPHEN 650 MG RE SUPP: 650.0000 mg | Freq: Four times a day (QID) | RECTAL | Status: DC | PRN

## 2020-08-13 NOTE — ED Notes (Signed)
Not able to obtain IV site.  Skin and veins thin and fragile.

## 2020-08-13 NOTE — ED Provider Notes (Signed)
[facility] EMERGENCY DEPARTMENT Provider Note   CSN: [id number] Arrival date & time: 08/13/20  5188     History Chief Complaint  Patient presents with  . Flank Pain    [name] is a 87 y.o. female.  Patient brought in by EMS from home.  Patient's son called EMS.  Patient with a complaint of right-sided flank pain right lateral chest pain weakness and patient's son says she has been not her usual self for about 3 days.  She currently lives by herself.  But son was planning on moving in and has been trying to take care of her.  Past medical history sniffing coronary disease hyperlipidemia hypertension mitral regurg prior stroke asthma and chronic headaches.  Patient not on any blood thinners.  She is alert and will answer questions little bit hard of hearing.  Eitel signs on presentation significant for temp 98.7 heart rate of 100 respirations 36 blood pressure 122/56.  Oxygen saturation on room air 93%.  Heart rate around 101.  Patient did not see anything about a cough but the son said she had a little bit of a cough.        Past Medical History:  Diagnosis Date  . Asthma   . Chronic headaches   . Coronary artery disease   . Hyperlipidemia   . Hypertension   . Mitral regurgitation   . Stroke [facility])     Patient Active Problem List   Diagnosis Date Noted  . Acute hypoxemic respiratory failure ([place]) 08/13/2020  . Altered mental status   . Dysarthria   . TIA (transient ischemic attack) 05/28/2020  . Cough 01/24/2012  . DYSPNEA 07/20/2009  . HYPERLIPIDEMIA 07/18/2009  . MITRAL REGURGITATION, 0 (MILD) 07/18/2009  . Primary hypertension 07/18/2009  . CAD, NATIVE VESSEL 07/18/2009    Past Surgical History:  Procedure Laterality Date  . APPENDECTOMY  1942  . BACK SURGERY    . CHOLECYSTECTOMY  1994  . PARTIAL HYSTERECTOMY  1973  . ROTATOR CUFF REPAIR       OB History   No obstetric history on file.     Family History  Problem Relation Age of Onset  .  Coronary artery disease Mother   . Diabetes Father   . COPD Father     Social History   Tobacco Use  . Smoking status: Former Research scientist (life sciences)  . Smokeless tobacco: Never Used  . Tobacco comment: pt states she only smoked 2 cigs a day x 24month.   Substance Use Topics  . Alcohol use: No  . Drug use: No    Home Medications Prior to Admission medications   Medication Sig Start Date End Date Taking? Authorizing Provider  acetaminophen (TYLENOL) 650 MG CR tablet Take 650 mg by mouth every 8 (eight) hours as needed for pain or fever.    [provider]  albuterol (PROVENTIL) (2.5 MG/3ML) 0.083% nebulizer solution Take 2.5 mg by nebulization 4 (four) times daily.    [provider]  alprazolam (Duanne Moron 2 MG tablet Take 2 mg by mouth 3 (three) times daily as needed for sleep.    [provider]  aspirin 325 MG tablet Take 1 tablet (325 mg total) by mouth daily. 05/30/20   MBarton Dubois MD  COD LIVER OIL W/VIT A & D PO Take 1 tablet by mouth daily.    [provider]  fluticasone (FLONASE) 50 MCG/ACT nasal spray Place 1 spray into the nose daily.    [provider]  insulin NPH-insulin regular (NOVOLIN 70/30) (70-30) 100 UNIT/ML injection Inject 25 Units into the skin 2 (two) times daily.    [provider]  ipratropium (ATROVENT) 0.02 % nebulizer solution Take 500 mcg by nebulization 4 (four) times daily.    [provider]  losartan (COZAAR) 100 MG tablet Take 100 mg by mouth daily. 04/05/20   [provider]  metoprolol succinate (TOPROL-XL) 25 MG 24 hr tablet Take 25 mg by mouth daily. 04/05/20   [provider]  Multiple Vitamins-Minerals (PROTEGRA PO) Take 1 tablet by mouth daily.    [provider]  pantoprazole (PROTONIX) 40 MG tablet Take 40 mg by mouth daily.    [provider]    Allergies    Gabapentin, Morphine and related, Percocet [oxycodone-acetaminophen], Povidone-iodine, and  Prednisone  Review of Systems   Review of Systems  Constitutional: Negative for chills and fever.  HENT: Negative for congestion, ear pain and sore throat.   Eyes: Negative for pain and visual disturbance.  Respiratory: Positive for cough. Negative for shortness of breath.   Cardiovascular: Positive for chest pain. Negative for palpitations.  Gastrointestinal: Positive for abdominal distention. Negative for abdominal pain and vomiting.  Genitourinary: Positive for flank pain. Negative for dysuria and hematuria.  Musculoskeletal: Negative for arthralgias and back pain.  Skin: Negative for color change and rash.  Neurological: Negative for seizures and syncope.  All other systems reviewed and are negative.   Physical Exam Updated Vital Signs BP 135/76   Pulse (!) 108   Temp 98.7 F (37.1 C) (Oral)   Resp (!) 31   Ht 1.524 m (5')   Wt 69.9 kg   SpO2 94%   BMI 30.08 kg/m   Physical Exam Vitals and nursing note reviewed.  Constitutional:      General: She is in acute distress.     Appearance: Normal appearance. She is well-developed.  HENT:     Head: Normocephalic and atraumatic.  Eyes:     Extraocular Movements: Extraocular movements intact.     Conjunctiva/sclera: Conjunctivae normal.     Pupils: Pupils are equal, round, and reactive to light.  Cardiovascular:     Rate and Rhythm: Regular rhythm. Tachycardia present.     Heart sounds: No murmur heard.   Pulmonary:     Effort: Respiratory distress present.     Breath sounds: Normal breath sounds. No wheezing.     Comments: To be some tenderness to palpation to the right lateral lower rib area.  Tachypneic Chest:     Chest wall: Tenderness present.  Abdominal:     Palpations: Abdomen is soft.     Tenderness: There is abdominal tenderness.     Comments: Right flank right upper quadrant  Musculoskeletal:     Cervical back: Neck supple.  Skin:    General: Skin is warm and dry.  Neurological:     General: No focal  deficit present.     Mental Status: She is alert. Mental status is at baseline.     ED Results / Procedures / Treatments   Labs (all labs ordered are listed, but only abnormal results are displayed) Labs Reviewed  COMPREHENSIVE METABOLIC PANEL - Abnormal; Notable for the following components:      Result Value   Chloride 96 (*)    Glucose, Bld 254 (*)    Calcium 8.8 (*)    Albumin 2.9 (*)    AST 60 (*)    ALT 55 (*)    Alkaline  Phosphatase 128 (*)    Total Bilirubin 1.7 (*)    All other components within normal limits  CBC WITH DIFFERENTIAL/PLATELET - Abnormal; Notable for the following components:   WBC 27.9 (*)    RBC 3.84 (*)    MCV 106.0 (*)    MCH 34.4 (*)    Neutro Abs 24.8 (*)    All other components within normal limits  CULTURE, BLOOD (ROUTINE X 2)  CULTURE, BLOOD (ROUTINE X 2)  RESP PANEL BY RT-PCR (FLU A&B, COVID) ARPGX2  URINE CULTURE  EXPECTORATED SPUTUM ASSESSMENT W GRAM STAIN, RFLX TO RESP C  MRSA PCR SCREENING  LIPASE, BLOOD  LACTIC ACID, PLASMA  URINALYSIS, ROUTINE W REFLEX MICROSCOPIC  LEGIONELLA PNEUMOPHILA SEROGP 1 UR AG  STREP PNEUMONIAE URINARY ANTIGEN  CBC  CREATININE, SERUM  HEMOGLOBIN A1C    EKG EKG Interpretation  Date/Time:  Sunday August 13 2020 11:04:29 EDT Ventricular Rate:  101 PR Interval:  179 QRS Duration: 78 QT Interval:  326 QTC Calculation: 423 R Axis:   -57 Text Interpretation: Sinus tachycardia Atrial premature complex Inferior infarct, old Probable anterior infarct, old Confirmed by Fredia Sorrow [phone]) on 08/13/2020 11:10:51 AM   Radiology CT Angio Chest PE W/Cm &/Or Wo Cm  Result Date: 08/13/2020 CLINICAL DATA:  Chest pain, right flank pain, weakness EXAM: CT ANGIOGRAPHY CHEST CT ABDOMEN AND PELVIS WITH CONTRAST TECHNIQUE: Multidetector CT imaging of the chest was performed using the standard protocol during bolus administration of intravenous contrast. Multiplanar CT image reconstructions and MIPs were obtained to  evaluate the vascular anatomy. Multidetector CT imaging of the abdomen and pelvis was performed using the standard protocol during bolus administration of intravenous contrast. CONTRAST:  159m OMNIPAQUE IOHEXOL 350 MG/ML SOLN COMPARISON:  CT abdomen pelvis 11/21/2003.  Same day chest x-ray. FINDINGS: CTA CHEST FINDINGS Cardiovascular: Satisfactory opacification of pulmonary arteries. No filling defect evident to the segmental branch level to suggest pulmonary embolism. Main pulmonary trunk is normal in caliber. Thoracic aorta is nonaneurysmal. Atherosclerotic calcifications of the aorta and coronary arteries. Heart size within normal limits. No pericardial effusion. Mediastinum/Nodes: No axillary, mediastinal, or hilar lymphadenopathy. Thyroid, trachea, and esophagus within normal limits. Lungs/Pleura: Small to moderate sized loculated pleural effusion along the posterior and lateral aspects of the right hemithorax. Fluid also tracks into the minor fissure. There is associated compressive atelectasis within the right lower lobe. Left lung is clear. No left-sided pleural effusion. No pneumothorax. Musculoskeletal: Chronic displaced posterior left twelfth rib fracture. No acute osseous findings. No chest wall abnormality. Review of the MIP images confirms the above findings. CT ABDOMEN and PELVIS FINDINGS Hepatobiliary: No focal liver abnormality is seen. Status post cholecystectomy. No biliary dilatation. Pancreas: Unremarkable. No pancreatic ductal dilatation or surrounding inflammatory changes. Spleen: Normal in size without focal abnormality. Adrenals/Urinary Tract: Unremarkable adrenal glands. Two right renal cysts are noted. Kidneys have otherwise symmetric enhancement. There is excreted contrast within the bilateral renal collecting systems. No definite renal stone. No hydronephrosis. Bilateral ureters are unremarkable. Urinary bladder within normal limits. Stomach/Bowel: Stomach is within normal limits.  Scattered colonic diverticulosis. No evidence of bowel wall thickening, distention, or inflammatory changes. Vascular/Lymphatic: Atherosclerotic calcification throughout the aortoiliac axis. No aneurysm. No abdominopelvic lymphadenopathy. Reproductive: Status post hysterectomy. No adnexal masses. Other: No free fluid. No abdominopelvic fluid collection. No pneumoperitoneum. Musculoskeletal: Prior lumbar fusion. Mild perihardware lucency associated with the right transpedicular screw at L1 (series 8, image 78). 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 Severe arthropathy of the right hip. No acute  osseous findings. Review of the MIP images confirms the above findings. IMPRESSION: 1. No evidence of pulmonary embolism. 2. Small-to-moderate loculated right-sided pleural effusion with associated compressive atelectasis. 3. No acute findings within the abdomen or pelvis. 4. Prior lumbar fusion with mild perihardware lucency associated with the right transpedicular screw at L1, suggestive of loosening. 5. Colonic diverticulosis without evidence of diverticulitis. 6. Aortic and coronary artery atherosclerosis (ICD10-I70.0). Electronically Signed   By: Davina Poke D.O.   On: 08/13/2020 14:22   CT Abdomen Pelvis W Contrast  Result Date: 08/13/2020 CLINICAL DATA:  Chest pain, right flank pain, weakness EXAM: CT ANGIOGRAPHY CHEST CT ABDOMEN AND PELVIS WITH CONTRAST TECHNIQUE: Multidetector CT imaging of the chest was performed using the standard protocol during bolus administration of intravenous contrast. Multiplanar CT image reconstructions and MIPs were obtained to evaluate the vascular anatomy. Multidetector CT imaging of the abdomen and pelvis was performed using the standard protocol during bolus administration of intravenous contrast. CONTRAST:  159m OMNIPAQUE IOHEXOL 350 MG/ML SOLN COMPARISON:  CT abdomen pelvis 11/21/2003.  Same day chest x-ray. FINDINGS: CTA CHEST FINDINGS Cardiovascular: Satisfactory  opacification of pulmonary arteries. No filling defect evident to the segmental branch level to suggest pulmonary embolism. Main pulmonary trunk is normal in caliber. Thoracic aorta is nonaneurysmal. Atherosclerotic calcifications of the aorta and coronary arteries. Heart size within normal limits. No pericardial effusion. Mediastinum/Nodes: No axillary, mediastinal, or hilar lymphadenopathy. Thyroid, trachea, and esophagus within normal limits. Lungs/Pleura: Small to moderate sized loculated pleural effusion along the posterior and lateral aspects of the right hemithorax. Fluid also tracks into the minor fissure. There is associated compressive atelectasis within the right lower lobe. Left lung is clear. No left-sided pleural effusion. No pneumothorax. Musculoskeletal: Chronic displaced posterior left twelfth rib fracture. No acute osseous findings. No chest wall abnormality. Review of the MIP images confirms the above findings. CT ABDOMEN and PELVIS FINDINGS Hepatobiliary: No focal liver abnormality is seen. Status post cholecystectomy. No biliary dilatation. Pancreas: Unremarkable. No pancreatic ductal dilatation or surrounding inflammatory changes. Spleen: Normal in size without focal abnormality. Adrenals/Urinary Tract: Unremarkable adrenal glands. Two right renal cysts are noted. Kidneys have otherwise symmetric enhancement. There is excreted contrast within the bilateral renal collecting systems. No definite renal stone. No hydronephrosis. Bilateral ureters are unremarkable. Urinary bladder within normal limits. Stomach/Bowel: Stomach is within normal limits. Scattered colonic diverticulosis. No evidence of bowel wall thickening, distention, or inflammatory changes. Vascular/Lymphatic: Atherosclerotic calcification throughout the aortoiliac axis. No aneurysm. No abdominopelvic lymphadenopathy. Reproductive: Status post hysterectomy. No adnexal masses. Other: No free fluid. No abdominopelvic fluid collection.  No pneumoperitoneum. Musculoskeletal: Prior lumbar fusion. Mild perihardware lucency associated with the right transpedicular screw at L1 (series 8, image 78). 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 Severe arthropathy of the right hip. No acute osseous findings. Review of the MIP images confirms the above findings. IMPRESSION: 1. No evidence of pulmonary embolism. 2. Small-to-moderate loculated right-sided pleural effusion with associated compressive atelectasis. 3. No acute findings within the abdomen or pelvis. 4. Prior lumbar fusion with mild perihardware lucency associated with the right transpedicular screw at L1, suggestive of loosening. 5. Colonic diverticulosis without evidence of diverticulitis. 6. Aortic and coronary artery atherosclerosis (ICD10-I70.0). Electronically Signed   By: NDavina PokeD.O.   On: 08/13/2020 14:22   DG Chest Port 1 View  Result Date: 08/13/2020 CLINICAL DATA:  Chest pain and weakness. EXAM: PORTABLE CHEST 1 VIEW COMPARISON:  05/28/2020 FINDINGS: Normal heart size. Aortic atherosclerosis. There is a new opacity overlying  the right mid and lower lung concerning for loculated pleural effusion. Fluid tracking along the major fissure is noted within the perihilar right lung. Left lung appears clear. IMPRESSION: New opacity overlying the right mid and lower lung concerning for loculated pleural effusion. Electronically Signed   By: Kerby Moors M.D.   On: 08/13/2020 11:13    Procedures Procedures   CRITICAL CARE Performed by: Fredia Sorrow Total critical care time: 45 minutes Critical care time was exclusive of separately billable procedures and treating other patients. Critical care was necessary to treat or prevent imminent or life-threatening deterioration. Critical care was time spent personally by me on the following activities: development of treatment plan with patient and/or surrogate as well as nursing, discussions with consultants, evaluation of  patient's response to treatment, examination of patient, obtaining history from patient or surrogate, ordering and performing treatments and interventions, ordering and review of laboratory studies, ordering and review of radiographic studies, pulse oximetry and re-evaluation of patient's condition.   Medications Ordered in ED Medications  0.9 %  sodium chloride infusion ( Intravenous Stopped 08/13/20 1320)  aspirin tablet 325 mg (325 mg Oral Not Given 08/13/20 1546)  ALPRAZolam (XANAX) tablet 2 mg (has no administration in time range)  pantoprazole (PROTONIX) EC tablet 40 mg (40 mg Oral Given 08/13/20 1544)  fluticasone (FLONASE) 50 MCG/ACT nasal spray 1 spray (1 spray Each Nare Given 08/13/20 1547)  enoxaparin (LOVENOX) injection 40 mg (40 mg Subcutaneous Given 08/13/20 1544)  acetaminophen (TYLENOL) tablet 650 mg (has no administration in time range)    Or  acetaminophen (TYLENOL) suppository 650 mg (has no administration in time range)  ondansetron (ZOFRAN) tablet 4 mg (has no administration in time range)    Or  ondansetron (ZOFRAN) injection 4 mg (has no administration in time range)  labetalol (NORMODYNE) injection 10 mg (has no administration in time range)  ipratropium-albuterol (DUONEB) 0.5-2.5 (3) MG/3ML nebulizer solution 3 mL (has no administration in time range)  insulin aspart (novoLOG) injection 0-9 Units (has no administration in time range)  insulin aspart (novoLOG) injection 0-5 Units (has no administration in time range)  lactated ringers bolus 1,000 mL (0 mLs Intravenous Stopped 08/13/20 1508)    And  lactated ringers bolus 1,000 mL (0 mLs Intravenous Stopped 08/13/20 1433)    And  lactated ringers bolus 250 mL (250 mLs Intravenous New Bag/Given 08/13/20 1509)  ceFEPIme (MAXIPIME) 2 g in sodium chloride 0.9 % 100 mL IVPB (0 g Intravenous Stopped 08/13/20 1407)  metroNIDAZOLE (FLAGYL) IVPB 500 mg (0 mg Intravenous Stopped 08/13/20 1508)  vancomycin (VANCOCIN) IVPB 1000 mg/200 mL  premix (0 mg Intravenous Stopped 08/13/20 1536)  iohexol (OMNIPAQUE) 350 MG/ML injection 100 mL (100 mLs Intravenous Contrast Given 08/13/20 1337)    ED Course  I have reviewed the triage vital signs and the nursing notes.  Pertinent labs & imaging results that were available during my care of the patient were reviewed by me and considered in my medical decision making (see chart for details).    MDM Rules/Calculators/A&P                          Patient on presentation was not febrile.  But was tachypneic and was tachycardic.  Blood pressure was fine with systolics in the 629B.  So did not initially initiate sepsis protocol.  But was somewhat concerned about it.  And her white blood cell came back markedly elevated differential shows that he has a  normal differential.  Based on the white blood cell count sepsis protocol was initiated to include the fluids and broad-spectrum antibiotics.  Chest x-ray raise some concerns about may be a pneumonia on the right lower lobe or may be a loculated pleural effusion.  CT scan abdomen pelvis and CT a of chest just reconfirmed the chest x-ray findings.  No abnormalities in the abdomen.  No pulmonary embolus.  Patient's lactic acid came back normal.  She is still tachypneic still tachycardic blood pressures are remaining very normal.  COVID testing negative.  Patient did not have any immunizations according to the son.  Labs significant for blood sugar of 254.  Past medical history does not list diabetes.  This could be new.  Liver function test have an elevation alk phos and a total bili of 1.7.  Mild elevation in the liver enzymes.   Urinalysis is still pending.  But CT shows no obstruction of the urinary tract system.  The patient can be admitted to telemetry.  Discussed with Dr. Manuella Ghazi from internal medicine who will admit the patient.  Urinalysis still pending.  But feel that this is a loculated pleural effusion suspect infection in that area.  Again CT  showed no evidence of pulmonary embolus.  Patient's remained normotensive.      Final Clinical Impression(s) / ED Diagnoses Final diagnoses:  Sepsis, due to unspecified organism, unspecified whether acute organ dysfunction present [facility])  Pleural effusion    Rx / DC Orders ED Discharge Orders    None       Fredia Sorrow, MD 08/13/20 [phone]

## 2020-08-13 NOTE — Progress Notes (Signed)
Telemetry called stating patient was in afib with RVR rate was 40. Rate now is 104 MD paged. MD placed order for 12 lead EKG and troponin levels.

## 2020-08-13 NOTE — ED Notes (Signed)
IVF switched to #22 left arm.  Pt off to xray and CT scans.

## 2020-08-13 NOTE — Progress Notes (Signed)
PHARMACY -  BRIEF ANTIBIOTIC NOTE   Pharmacy has received consult(s) for Vancomycin and Cefepime from an ED provider.  The patient's profile has been reviewed for ht/wt/allergies/indication/available labs.    One time order(s) placed for Vancomycin 1gm and Cefepime 2gm  Further antibiotics/pharmacy consults should be ordered by admitting physician if indicated.                       Thank you, Valrie Hart A 08/13/2020  1:53 PM

## 2020-08-13 NOTE — H&P (Addendum)
History and Physical    Judy ConverseChristine S Ellett EAV:[id number] DOB: 1925-05-25 DOA: 08/13/2020  PCP: Elfredia NevinsFusco, Lawrence, MD   Patient coming from: Home  Chief Complaint: Right flank/chest pain with weakness  HPI: [name] is a 87 y.o. female with medical history significant for asthma, hyperlipidemia, hypertension, prior TIA, DM2, mitral regurgitation, GERD, anxiety, and osteoarthritis who presented to the ED with complaints of some weakness as well as right flank pain and chest pain that began several days ago.  She has also been noted to have some mild altered mentation over the last 3 days.  She has been noted to have cough productive of greenish sputum as well as some mild fevers.  Patient lives at home by herself and has been falling more at home over the last several days even despite using her walker.  Her son lives just behind her home and checks up on her regularly and has to make frequent visits to the home in order to pick her up every time she falls.  No significant abdominal pain, nausea, vomiting, or diarrhea no other events noted.   ED Course: Vital signs demonstrating tachypnea, tachycardia, and low oxygen saturations.  Her vitals are now more stable after fluid bolus and she is currently on 2 L nasal cannula oxygen.  She was noted to have leukocytosis of over 27,000.  She has had contrast CT with chest and abdomen demonstrating no PE, but a loculated right-sided pleural effusion with some compressive atelectasis.  EKG with sinus tachycardia noted.  She has been given fluid boluses as well as vancomycin and cefepime.  COVID testing as well as urine testing currently pending.  Review of Systems: Reviewed as noted above, otherwise negative.  Past Medical History:  Diagnosis Date  . Asthma   . Chronic headaches   . Coronary artery disease   . Hyperlipidemia   . Hypertension   . Mitral regurgitation   . Stroke [facility](HCC)     Past Surgical History:  Procedure Laterality Date  .  APPENDECTOMY  1942  . BACK SURGERY    . CHOLECYSTECTOMY  1994  . PARTIAL HYSTERECTOMY  1973  . ROTATOR CUFF REPAIR       reports that she has quit smoking. She has never used smokeless tobacco. She reports that she does not drink alcohol and does not use drugs.  Allergies  Allergen Reactions  . Gabapentin     Other reaction(s): Unknown  . Morphine And Related     Family stated, "Morphine makes her wild and crazy"  . Percocet [Oxycodone-Acetaminophen]   . Povidone-Iodine     Other reaction(s): Unknown  . Prednisone     Family History  Problem Relation Age of Onset  . Coronary artery disease Mother   . Diabetes Father   . COPD Father     Prior to Admission medications   Medication Sig Start Date End Date Taking? Authorizing Provider  acetaminophen (TYLENOL) 650 MG CR tablet Take 650 mg by mouth every 8 (eight) hours as needed for pain or fever.    [provider]  albuterol (PROVENTIL) (2.5 MG/3ML) 0.083% nebulizer solution Take 2.5 mg by nebulization 4 (four) times daily.    [provider]  alprazolam Prudy Feeler(XANAX) 2 MG tablet Take 2 mg by mouth 3 (three) times daily as needed for sleep.    [provider]  aspirin 325 MG tablet Take 1 tablet (325 mg total) by mouth daily. 05/30/20   Vassie LollMadera, Carlos, MD  COD LIVER OIL  W/VIT A & D PO Take 1 tablet by mouth daily.    [provider]  fluticasone (FLONASE) 50 MCG/ACT nasal spray Place 1 spray into the nose daily.    [provider]  insulin NPH-insulin regular (NOVOLIN 70/30) (70-30) 100 UNIT/ML injection Inject 25 Units into the skin 2 (two) times daily.    [provider]  ipratropium (ATROVENT) 0.02 % nebulizer solution Take 500 mcg by nebulization 4 (four) times daily.    [provider]  losartan (COZAAR) 100 MG tablet Take 100 mg by mouth daily. 04/05/20   [provider]  metoprolol succinate (TOPROL-XL) 25 MG 24 hr tablet Take 25 mg by mouth daily. 04/05/20    [provider]  Multiple Vitamins-Minerals (PROTEGRA PO) Take 1 tablet by mouth daily.    [provider]  pantoprazole (PROTONIX) 40 MG tablet Take 40 mg by mouth daily.    [provider]    Physical Exam: Vitals:   08/13/20 1230 08/13/20 1300 08/13/20 1330 08/13/20 1430  BP: 123/72 (!) 130/59 (!) 148/62 139/68  Pulse: (!) 101 (!) 104 (!) 105 (!) 111  Resp: (!) 32 (!) 29 (!) 23 (!) 34  Temp:      TempSrc:      SpO2: 94% 94% (!) 89% 94%  Weight:      Height:        Constitutional: NAD, calm, comfortable, hard of hearing, confused Vitals:   08/13/20 1230 08/13/20 1300 08/13/20 1330 08/13/20 1430  BP: 123/72 (!) 130/59 (!) 148/62 139/68  Pulse: (!) 101 (!) 104 (!) 105 (!) 111  Resp: (!) 32 (!) 29 (!) 23 (!) 34  Temp:      TempSrc:      SpO2: 94% 94% (!) 89% 94%  Weight:      Height:       Eyes: lids and conjunctivae normal Neck: normal, supple Respiratory: clear to auscultation bilaterally. Normal respiratory effort. No accessory muscle use.  Currently on 2 L nasal cannula oxygen Cardiovascular: Regular rate and rhythm, no murmurs. Abdomen: no tenderness, no distention. Bowel sounds positive.  Musculoskeletal:  No edema. Skin: no rashes, lesions, ulcers.  Psychiatric: Flat affect  Labs on Admission: I have personally reviewed following labs and imaging studies  CBC: Recent Labs  Lab 08/13/20 1041  WBC 27.9*  NEUTROABS 24.8*  HGB 13.2  HCT 40.7  MCV 106.0*  PLT 321   Basic Metabolic Panel: Recent Labs  Lab 08/13/20 1041  NA 138  K 3.6  CL 96*  CO2 29  GLUCOSE 254*  BUN 22  CREATININE 0.83  CALCIUM 8.8*   GFR: Estimated Creatinine Clearance: 37.8 mL/min (by C-G formula based on SCr of 0.83 mg/dL). Liver Function Tests: Recent Labs  Lab 08/13/20 1041  AST 60*  ALT 55*  ALKPHOS 128*  BILITOT 1.7*  PROT 7.0  ALBUMIN 2.9*   Recent Labs  Lab 08/13/20 1041  LIPASE 24   No results for input(s): AMMONIA in the last  168 hours. Coagulation Profile: No results for input(s): INR, PROTIME in the last 168 hours. Cardiac Enzymes: No results for input(s): CKTOTAL, CKMB, CKMBINDEX, TROPONINI in the last 168 hours. BNP (last 3 results) No results for input(s): PROBNP in the last 8760 hours. HbA1C: No results for input(s): HGBA1C in the last 72 hours. CBG: No results for input(s): GLUCAP in the last 168 hours. Lipid Profile: No results for input(s): CHOL, HDL, LDLCALC, TRIG, CHOLHDL, LDLDIRECT in the last 72 hours. Thyroid Function  Tests: No results for input(s): TSH, T4TOTAL, FREET4, T3FREE, THYROIDAB in the last 72 hours. Anemia Panel: No results for input(s): VITAMINB12, FOLATE, FERRITIN, TIBC, IRON, RETICCTPCT in the last 72 hours. Urine analysis:    Component Value Date/Time   COLORURINE YELLOW 05/28/2020 1549   APPEARANCEUR CLEAR 05/28/2020 1549   LABSPEC 1.008 05/28/2020 1549   PHURINE 6.0 05/28/2020 1549   GLUCOSEU NEGATIVE 05/28/2020 1549   HGBUR NEGATIVE 05/28/2020 1549   BILIRUBINUR NEGATIVE 05/28/2020 1549   KETONESUR NEGATIVE 05/28/2020 1549   PROTEINUR NEGATIVE 05/28/2020 1549   NITRITE NEGATIVE 05/28/2020 1549   LEUKOCYTESUR LARGE (A) 05/28/2020 1549    Radiological Exams on Admission: CT Angio Chest PE W/Cm &/Or Wo Cm  Result Date: 08/13/2020 CLINICAL DATA:  Chest pain, right flank pain, weakness EXAM: CT ANGIOGRAPHY CHEST CT ABDOMEN AND PELVIS WITH CONTRAST TECHNIQUE: Multidetector CT imaging of the chest was performed using the standard protocol during bolus administration of intravenous contrast. Multiplanar CT image reconstructions and MIPs were obtained to evaluate the vascular anatomy. Multidetector CT imaging of the abdomen and pelvis was performed using the standard protocol during bolus administration of intravenous contrast. CONTRAST:  OMNIPAQUE IOHEXOL 350 MG/ML SOLN COMPARISON:  CT abdomen pelvis 11/21/2003.  Same day chest x-ray. FINDINGS: CTA CHEST FINDINGS  Cardiovascular: Satisfactory opacification of pulmonary arteries. No filling defect evident to the segmental branch level to suggest pulmonary embolism. Main pulmonary trunk is normal in caliber. Thoracic aorta is nonaneurysmal. Atherosclerotic calcifications of the aorta and coronary arteries. Heart size within normal limits. No pericardial effusion. Mediastinum/Nodes: No axillary, mediastinal, or hilar lymphadenopathy. Thyroid, trachea, and esophagus within normal limits. Lungs/Pleura: Small to moderate sized loculated pleural effusion along the posterior and lateral aspects of the right hemithorax. Fluid also tracks into the minor fissure. There is associated compressive atelectasis within the right lower lobe. Left lung is clear. No left-sided pleural effusion. No pneumothorax. Musculoskeletal: Chronic displaced posterior left twelfth rib fracture. No acute osseous findings. No chest wall abnormality. Review of the MIP images confirms the above findings. CT ABDOMEN and PELVIS FINDINGS Hepatobiliary: No focal liver abnormality is seen. Status post cholecystectomy. No biliary dilatation. Pancreas: Unremarkable. No pancreatic ductal dilatation or surrounding inflammatory changes. Spleen: Normal in size without focal abnormality. Adrenals/Urinary Tract: Unremarkable adrenal glands. Two right renal cysts are noted. Kidneys have otherwise symmetric enhancement. There is excreted contrast within the bilateral renal collecting systems. No definite renal stone. No hydronephrosis. Bilateral ureters are unremarkable. Urinary bladder within normal limits. Stomach/Bowel: Stomach is within normal limits. Scattered colonic diverticulosis. No evidence of bowel wall thickening, distention, or inflammatory changes. Vascular/Lymphatic: Atherosclerotic calcification throughout the aortoiliac axis. No aneurysm. No abdominopelvic lymphadenopathy. Reproductive: Status post hysterectomy. No adnexal masses. Other: No free fluid. No  abdominopelvic fluid collection. No pneumoperitoneum. Musculoskeletal: Prior lumbar fusion. Mild perihardware lucency associated with the right transpedicular screw at L1 (series 8, image 78). 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 Severe arthropathy of the right hip. No acute osseous findings. Review of the MIP images confirms the above findings. IMPRESSION: 1. No evidence of pulmonary embolism. 2. Small-to-moderate loculated right-sided pleural effusion with associated compressive atelectasis. 3. No acute findings within the abdomen or pelvis. 4. Prior lumbar fusion with mild perihardware lucency associated with the right transpedicular screw at L1, suggestive of loosening. 5. Colonic diverticulosis without evidence of diverticulitis. 6. Aortic and coronary artery atherosclerosis (ICD10-I70.0). Electronically Signed   By: Duanne Guess D.O.   On: 08/13/2020 14:22   CT Abdomen Pelvis W Contrast  Result Date: 08/13/2020 CLINICAL DATA:  Chest pain, right flank pain, weakness EXAM: CT ANGIOGRAPHY CHEST CT ABDOMEN AND PELVIS WITH CONTRAST TECHNIQUE: Multidetector CT imaging of the chest was performed using the standard protocol during bolus administration of intravenous contrast. Multiplanar CT image reconstructions and MIPs were obtained to evaluate the vascular anatomy. Multidetector CT imaging of the abdomen and pelvis was performed using the standard protocol during bolus administration of intravenous contrast. CONTRAST:  OMNIPAQUE IOHEXOL 350 MG/ML SOLN COMPARISON:  CT abdomen pelvis 11/21/2003.  Same day chest x-ray. FINDINGS: CTA CHEST FINDINGS Cardiovascular: Satisfactory opacification of pulmonary arteries. No filling defect evident to the segmental branch level to suggest pulmonary embolism. Main pulmonary trunk is normal in caliber. Thoracic aorta is nonaneurysmal. Atherosclerotic calcifications of the aorta and coronary arteries. Heart size within normal limits. No pericardial effusion.  Mediastinum/Nodes: No axillary, mediastinal, or hilar lymphadenopathy. Thyroid, trachea, and esophagus within normal limits. Lungs/Pleura: Small to moderate sized loculated pleural effusion along the posterior and lateral aspects of the right hemithorax. Fluid also tracks into the minor fissure. There is associated compressive atelectasis within the right lower lobe. Left lung is clear. No left-sided pleural effusion. No pneumothorax. Musculoskeletal: Chronic displaced posterior left twelfth rib fracture. No acute osseous findings. No chest wall abnormality. Review of the MIP images confirms the above findings. CT ABDOMEN and PELVIS FINDINGS Hepatobiliary: No focal liver abnormality is seen. Status post cholecystectomy. No biliary dilatation. Pancreas: Unremarkable. No pancreatic ductal dilatation or surrounding inflammatory changes. Spleen: Normal in size without focal abnormality. Adrenals/Urinary Tract: Unremarkable adrenal glands. Two right renal cysts are noted. Kidneys have otherwise symmetric enhancement. There is excreted contrast within the bilateral renal collecting systems. No definite renal stone. No hydronephrosis. Bilateral ureters are unremarkable. Urinary bladder within normal limits. Stomach/Bowel: Stomach is within normal limits. Scattered colonic diverticulosis. No evidence of bowel wall thickening, distention, or inflammatory changes. Vascular/Lymphatic: Atherosclerotic calcification throughout the aortoiliac axis. No aneurysm. No abdominopelvic lymphadenopathy. Reproductive: Status post hysterectomy. No adnexal masses. Other: No free fluid. No abdominopelvic fluid collection. No pneumoperitoneum. Musculoskeletal: Prior lumbar fusion. Mild perihardware lucency associated with the right transpedicular screw at L1 (series 8, image 78). 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 Severe arthropathy of the right hip. No acute osseous findings. Review of the MIP images confirms the above findings.  IMPRESSION: 1. No evidence of pulmonary embolism. 2. Small-to-moderate loculated right-sided pleural effusion with associated compressive atelectasis. 3. No acute findings within the abdomen or pelvis. 4. Prior lumbar fusion with mild perihardware lucency associated with the right transpedicular screw at L1, suggestive of loosening. 5. Colonic diverticulosis without evidence of diverticulitis. 6. Aortic and coronary artery atherosclerosis (ICD10-I70.0). Electronically Signed   By: Duanne Guess D.O.   On: 08/13/2020 14:22   DG Chest Port 1 View  Result Date: 08/13/2020 CLINICAL DATA:  Chest pain and weakness. EXAM: PORTABLE CHEST 1 VIEW COMPARISON:  05/28/2020 FINDINGS: Normal heart size. Aortic atherosclerosis. There is a new opacity overlying the right mid and lower lung concerning for loculated pleural effusion. Fluid tracking along the major fissure is noted within the perihilar right lung. Left lung appears clear. IMPRESSION: New opacity overlying the right mid and lower lung concerning for loculated pleural effusion. Electronically Signed   By: Signa Kell M.D.   On: 08/13/2020 11:13    EKG: Independently reviewed. ST 101bpm, low voltage.  Assessment/Plan Active Problems:   Acute hypoxemic respiratory failure (HCC)    Sepsis likely secondary to possible right-sided empyema -Loculated fluid noted with  compressive atelectasis to right lung field -Received fluid bolus in the ED, lactic acid 1.7 -Plan for ultrasound thoracentesis in a.m. -Continue on cefepime and vancomycin as ordered -MRSA PCR -COVID swab pending -Urine analysis pending  Acute metabolic encephalopathy secondary to above -In the setting of what appears to be mild cognitive impairment versus dementia -Continue to monitor closely -Urine analysis pending  Acute hypoxemic respiratory failure secondary to above -Wean as tolerated based on treatment as above -Initially on room air at home  History of  hypertension -Hold home antihypertensive agents and monitor with potential sepsis physiology -Labetalol as needed for significant elevations  Type 2 diabetes -Hemoglobin A1c 6.0% on 05/2020 -Carb modified diet -SSI  Dyslipidemia -Appears to be diet controlled  Frequent falls/debility -PT/OT evaluation with likely need for placement -Fall precautions  GERD -PPI  History of anxiety -As needed anxiolytics  History of asthma/allergic rhinitis -Bronchodilators as needed for wheezing or shortness of breath   DVT prophylaxis: Lovenox Code Status: DNR Family Communication: Son, Alden Server, at bedside Disposition Plan:Admit for treatment of possible empyema/sepsis Consults called:None Admission status: Inpatient, Tele   Kamelia Lampkins D Mikiya Nebergall DO [facility]  If 7PM-7AM, please contact night-coverage [email or web address]  08/13/2020, 2:42 PM

## 2020-08-13 NOTE — Progress Notes (Signed)
Pharmacy Antibiotic Note  [name] is a 87 y.o. female admitted on 08/13/2020 with sepsis.  Pharmacy has been consulted for Vancomycin and Cefepime dosing.  Plan: Cefepime 2gm IV q12hrs Vancomycin 1gm IV q24hrs   Height: 5' (152.4 cm) Weight: 69.9 kg (154 lb) IBW/kg (Calculated) : 45.5  Temp (24hrs), Avg:98.5 F (36.9 C), Min:98.2 F (36.8 C), Max:98.7 F (37.1 C)  Recent Labs  Lab 08/13/20 1041 08/13/20 1308  WBC 27.9*  --   CREATININE 0.83  --   LATICACIDVEN  --  1.7    Estimated Creatinine Clearance: 37.8 mL/min (by C-G formula based on SCr of 0.83 mg/dL).    Allergies  Allergen Reactions  . Gabapentin     Other reaction(s): Unknown  . Morphine And Related     Family stated, "Morphine makes her wild and crazy"  . Percocet [Oxycodone-Acetaminophen]   . Povidone-Iodine     Other reaction(s): Unknown  . Prednisone     Antimicrobials this admission:   >>    >>   Dose adjustments this admission:   Microbiology results:  BCx:   UCx:    Sputum:    MRSA PCR:   Thank you for allowing pharmacy to be a part of this patient's care.  Valrie Hart A 08/13/2020 6:39 PM

## 2020-08-13 NOTE — ED Notes (Signed)
Second nurse to attempt IV 

## 2020-08-13 NOTE — ED Triage Notes (Signed)
Pt c/o of right flank pain and weakness

## 2020-08-14 ENCOUNTER — Inpatient Hospital Stay (HOSPITAL_COMMUNITY): Payer: Medicare Other

## 2020-08-14 LAB — CBC
HCT: 35 % — ABNORMAL LOW (ref 36.0–46.0)
Hemoglobin: 11.3 g/dL — ABNORMAL LOW (ref 12.0–15.0)
MCH: 34.1 pg — ABNORMAL HIGH (ref 26.0–34.0)
MCHC: 32.3 g/dL (ref 30.0–36.0)
MCV: 105.7 fL — ABNORMAL HIGH (ref 80.0–100.0)
Platelets: 325 10*3/uL (ref 150–400)
RBC: 3.31 MIL/uL — ABNORMAL LOW (ref 3.87–5.11)
RDW: 12.2 % (ref 11.5–15.5)
WBC: 28 10*3/uL — ABNORMAL HIGH (ref 4.0–10.5)
nRBC: 0 % (ref 0.0–0.2)

## 2020-08-14 LAB — BASIC METABOLIC PANEL
Anion gap: 10 (ref 5–15)
BUN: 17 mg/dL (ref 8–23)
CO2: 27 mmol/L (ref 22–32)
Calcium: 8.3 mg/dL — ABNORMAL LOW (ref 8.9–10.3)
Chloride: 100 mmol/L (ref 98–111)
Creatinine, Ser: 0.63 mg/dL (ref 0.44–1.00)
GFR, Estimated: >60 mL/min (ref >60)
Glucose, Bld: 172 mg/dL — ABNORMAL HIGH (ref 70–99)
Potassium: 3.5 mmol/L (ref 3.5–5.1)
Sodium: 137 mmol/L (ref 135–145)

## 2020-08-14 LAB — GLUCOSE, CAPILLARY
Glucose-Capillary: 171 mg/dL — ABNORMAL HIGH (ref 70–99)
Glucose-Capillary: 328 mg/dL — ABNORMAL HIGH (ref 70–99)
Glucose-Capillary: 231 mg/dL — ABNORMAL HIGH (ref 70–99)
Glucose-Capillary: 247 mg/dL — ABNORMAL HIGH (ref 70–99)

## 2020-08-14 LAB — MAGNESIUM: Magnesium: 1.9 mg/dL (ref 1.7–2.4)

## 2020-08-14 NOTE — Progress Notes (Signed)
Transition of Care (TOC) -30 day Note       Patient Details  Name: [name] MRN: [id number] Date of Birth: Nov 06, 1925   Transition of Care [facility]) CM/SW Contact  Name: Elliot Gault Phone Number: [phone] Date: 08/14/2020  Time: 1450   MUST ID: [id number]   To Whom it May Concern:   Please be advised that the above patient will require a short-term nursing home stay, anticipated 30 days or less rehabilitation and strengthening. The plan is for return home.

## 2020-08-14 NOTE — Evaluation (Signed)
Physical Therapy Evaluation Patient Details Name: [name] MRN: [id number] DOB: [birth year] Today's Date: 08/14/2020   History of Present Illness  HPI: [name] is a 87 y.o. female with medical history significant for asthma, hyperlipidemia, hypertension, prior TIA, DM2, mitral regurgitation, GERD, anxiety, and osteoarthritis who presented to the ED with complaints of some weakness as well as right flank pain and chest pain that began several days ago.  She has also been noted to have some mild altered mentation over the last 3 days.  She has been noted to have cough productive of greenish sputum as well as some mild fevers.  Patient lives at home by herself and has been falling more at home over the last several days even despite using her walker.  Her son lives just behind her home and checks up on her regularly and has to make frequent visits to the home in order to pick her up every time she falls.  No significant abdominal pain, nausea, vomiting, or diarrhea no other events noted.    Clinical Impression  Patient demonstrates slow labored movement for sitting up at bedside with c/o severe low back and right side pain, very unsteady on feet and limited to a few side steps due to generalized weakness and poor standing balance.  Patient tolerated sitting up in chair after therapy.  Patient will benefit from continued physical therapy in hospital and recommended venue below to increase strength, balance, endurance for safe ADLs and gait.      Follow Up Recommendations SNF    Equipment Recommendations  None recommended by PT    Recommendations for Other Services       Precautions / Restrictions Precautions Precautions: Fall Restrictions Weight Bearing Restrictions: No      Mobility  Bed Mobility Overal bed mobility: Needs Assistance Bed Mobility: Supine to Sit     Supine to sit: Max assist     General bed mobility comments: slow labored movement with c/o severe  pain low back    Transfers Overall transfer level: Needs assistance Equipment used: Rolling walker (2 wheeled) Transfers: Sit to/from [employer] Sit to Stand: Mod assist Stand pivot transfers: Mod assist       General transfer comment: increased time, labored movement  Ambulation/Gait Ambulation/Gait assistance: Mod assist;Max assist Gait Distance (Feet): 5 Feet Assistive device: Rolling walker (2 wheeled) Gait Pattern/deviations: Decreased step length - right;Decreased step length - left;Decreased stride length Gait velocity: decreased   General Gait Details: limited to 5-6 slow labored side steps due to generalized weakness, poor standing balance  Stairs            Wheelchair Mobility    Modified Rankin (Stroke Patients Only)       Balance Overall balance assessment: Needs assistance Sitting-balance support: Feet supported;No upper extremity supported Sitting balance-Leahy Scale: Fair Sitting balance - Comments: fair/good seated at EOB   Standing balance support: Bilateral upper extremity supported;During functional activity Standing balance-Leahy Scale: Poor Standing balance comment: using RW                             Pertinent Vitals/Pain Pain Assessment: Faces Faces Pain Scale: Hurts little more Pain Location: Low back Pain Descriptors / Indicators: Guarding;Grimacing;Sore Pain Intervention(s): Limited activity within patient's tolerance;Monitored during session;Repositioned    Home Living Family/patient expects to be discharged to:: Private residence Living Arrangements: Alone Available Help at Discharge: Family Type of Home: House Home Access: Ramped  entrance     Home Layout: One level Home Equipment: Walker - 4 wheels;Walker - 2 wheels;Grab bars - tub/shower;Bedside commode;Tub bench Additional Comments: Patient is poor historian, most info from previous admission    Prior Function Level of Independence: Needs  assistance   Gait / Transfers Assistance Needed: ambulates with RW and Rollator household distances.  ADL's / Homemaking Assistance Needed: independent BADLs per pt report;  assistance for grocery shopping and meal set up per document review from recent visit.        Hand Dominance   Dominant Hand: Right    Extremity/Trunk Assessment   Upper Extremity Assessment Upper Extremity Assessment: Defer to OT evaluation    Lower Extremity Assessment Lower Extremity Assessment: Generalized weakness    Cervical / Trunk Assessment Cervical / Trunk Assessment: Normal  Communication   Communication: Expressive difficulties (some times difficult to understand)  Cognition Arousal/Alertness: Awake/alert Behavior During Therapy: WFL for tasks assessed/performed Overall Cognitive Status: No family/caregiver present to determine baseline cognitive functioning                                 General Comments: Slowed repsonse to one step commands.      General Comments      Exercises     Assessment/Plan    PT Assessment Patient needs continued PT services  PT Problem List Decreased strength;Decreased activity tolerance;Decreased balance;Decreased mobility       PT Treatment Interventions DME instruction;Gait training;Stair training;Functional mobility training;Therapeutic activities;Therapeutic exercise;Patient/family education;Balance training    PT Goals (Current goals can be found in the Care Plan section)  Acute Rehab PT Goals Patient Stated Goal: return home with family to assist PT Goal Formulation: With patient Time For Goal Achievement: 08/28/20 Potential to Achieve Goals: Good    Frequency Min 3X/week   Barriers to discharge        Co-evaluation PT/OT/SLP Co-Evaluation/Treatment: Yes Reason for Co-Treatment: To address functional/ADL transfers;For patient/therapist safety PT goals addressed during session: Mobility/safety with mobility;Proper use of  DME;Balance OT goals addressed during session: ADL's and self-care;Strengthening/ROM       AM-PAC PT "6 Clicks" Mobility  Outcome Measure Help needed turning from your back to your side while in a flat bed without using bedrails?: A Lot Help needed moving from lying on your back to sitting on the side of a flat bed without using bedrails?: A Lot Help needed moving to and from a bed to a chair (including a wheelchair)?: A Lot Help needed standing up from a chair using your arms (e.g., wheelchair or bedside chair)?: A Lot Help needed to walk in hospital room?: A Lot Help needed climbing 3-5 steps with a railing? : Total 6 Click Score: 11    End of Session Equipment Utilized During Treatment: Oxygen Activity Tolerance: Patient tolerated treatment well;Patient limited by fatigue Patient left: in chair;with call bell/phone within reach Nurse Communication: Mobility status PT Visit Diagnosis: Unsteadiness on feet (R26.81);Other abnormalities of gait and mobility (R26.89);Muscle weakness (generalized) (M62.81)    Time: 5621-3086 PT Time Calculation (min) (ACUTE ONLY): 29 min   Charges:   PT Evaluation $PT Eval Moderate Complexity: 1 Mod PT Treatments $Therapeutic Activity: 23-37 mins        12:14 PM, 08/14/20 [name], MPT Physical Therapist with [facility] [phone] office [phone] mobile phone

## 2020-08-14 NOTE — Progress Notes (Signed)
Initial Nutrition Assessment  DOCUMENTATION CODES:   Obesity unspecified  INTERVENTION:  Magic cup BID with lunch and dinner meals, each supplement provides 290 kcal and 9 grams of protein    Encourage meal intake   Provide tray set-up   NUTRITION DIAGNOSIS:   Inadequate oral intake related to acute illness (respiratory failure) as evidenced by per patient/family report (meal intake 50%).   GOAL:  Patient will meet greater than or equal to 90% of their needs  MONITOR:  PO intake,Labs,Supplement acceptance  REASON FOR ASSESSMENT:   Malnutrition Screening Tool    ASSESSMENT: Patient is a 87 yo female with hx of DM2, GERD, HTN, falls and asthma. Acute respiratory failure, sepsis likely related to possible right sided empyema. Mild cognitive impairment.   Patient up in chair during RD visit and no family at bedside. Fair appetite- she ate 50% of breakfast this morning per nursing. Feeds herself.   Weight history reviewed. No significant change.  Medications: insulin.   IV- antibiotics  IVF-NS@75  ml/hr  Labs: BMP Latest Ref Rng & Units 08/14/2020 08/13/2020 05/28/2020  Glucose 70 - 99 mg/dL 962(I) 297(L) 892(J)  BUN 8 - 23 mg/dL 17 22 10   Creatinine 0.44 - 1.00 mg/dL 1.94 1.74  Sodium 135 - 145 mmol/L 137 138 132(L)  Potassium 3.5 - 5.1 mmol/L 3.5 3.6 4.9  Chloride 98 - 111 mmol/L 100 96(L) 95(L)  CO2 22 - 32 mmol/L 27 29 -  Calcium 8.9 - 10.3 mg/dL 8.3(L) 8.8(L) -     NUTRITION - FOCUSED PHYSICAL EXAM:  Nutrition-Focused physical exam completed. Findings are mild upper arm fat depletion, mild temporal muscle depletion, and no edema.     Diet Order:   Diet Order            DIET - DYS 1 Room service appropriate? Yes; Fluid consistency: Thin  Diet effective now                 EDUCATION NEEDS:  No education needs have been identified at this time  Skin:  Skin Assessment: Reviewed RN Assessment  Last BM:  4/21  Height:   Ht Readings from Last 1  Encounters:  08/13/20 5' (1.524 m)    Weight:   Wt Readings from Last 1 Encounters:  08/13/20 69.9 kg    Ideal Body Weight:   45 kg  BMI:  Body mass index is 30.08 kg/m.  Estimated Nutritional Needs:   Kcal:  1200-1400  Protein:  68-75 gr  Fluid:  >1500 ml daily   08/15/20 MS,RD,CSG,LDN Contact: [email or web address]

## 2020-08-14 NOTE — Plan of Care (Signed)
  Problem: Acute Rehab OT Goals (only OT should resolve) Goal: Pt. Will Perform Grooming Flowsheets (Taken 08/14/2020 1142) Pt Will Perform Grooming:  with modified independence  standing Goal: Pt. Will Perform Upper Body Dressing Flowsheets (Taken 08/14/2020 1142) Pt Will Perform Upper Body Dressing:  with modified independence  sitting Goal: Pt. Will Perform Lower Body Dressing Flowsheets (Taken 08/14/2020 1142) Pt Will Perform Lower Body Dressing:  with supervision  sitting/lateral leans  sit to/from stand  with adaptive equipment Goal: Pt. Will Transfer To Toilet Flowsheets (Taken 08/14/2020 1142) Pt Will Transfer to Toilet:  with supervision  grab bars  stand pivot transfer Goal: Pt/Caregiver Will Perform Home Exercise Program Flowsheets (Taken 08/14/2020 1142) Pt/caregiver will Perform Home Exercise Program:  Increased ROM  Increased strength  Both right and left upper extremity  With Supervision  Gracen Ringwald OT, MOT

## 2020-08-14 NOTE — TOC Initial Note (Signed)
Transition of Care [facility]) - Initial/Assessment Note    Patient Details  Name: [name] MRN: [id number] Date of Birth: August 11, 1925  Transition of Care [facility]) CM/SW Contact:    Elliot Gault, LCSW Phone Number: 08/14/2020, 2:35 PM  Clinical Narrative:                  Pt admitted from home where she lives alone. Pt's son lives nearby. PT recommending SNF rehab at dc. Spoke with pt's son today to discuss. Son reports that family agreeable to SNF rehab referrals. Informed son of CMS provider options and will refer as requested.  MD anticipating dc mid week. Will start insurance auth. TOC will follow.  Expected Discharge Plan: Skilled Nursing Facility Barriers to Discharge: Insurance Authorization,Continued Medical Work up   Patient Goals and CMS Choice Patient states their goals for this hospitalization and ongoing recovery are:: get better CMS Medicare.gov Compare Post Acute Care list provided to:: Patient Represenative (must comment) Choice offered to / list presented to : Adult Children  Expected Discharge Plan and Services Expected Discharge Plan: Skilled Nursing Facility In-house Referral: Clinical Social Work   Post Acute Care Choice: Skilled Nursing Facility Living arrangements for the past 2 months: Single Family Home                                      Prior Living Arrangements/Services Living arrangements for the past 2 months: Single Family Home Lives with:: Self Patient language and need for interpreter reviewed:: Yes Do you feel safe going back to the place where you live?: Yes      Need for Family Participation in Patient Care: Yes (Comment) Care giver support system in place?: Yes (comment)   Criminal Activity/Legal Involvement Pertinent to Current Situation/Hospitalization: No - Comment as needed  Activities of Daily Living Home Assistive Devices/Equipment: Eyeglasses,Walker (specify type),Cane (specify quad or straight),Shower chair without  back ADL Screening (condition at time of admission) Patient's cognitive ability adequate to safely complete daily activities?: No Is the patient deaf or have difficulty hearing?: Yes Does the patient have difficulty seeing, even when wearing glasses/contacts?: Yes Does the patient have difficulty concentrating, remembering, or making decisions?: No Patient able to express need for assistance with ADLs?: Yes Does the patient have difficulty dressing or bathing?: Yes Independently performs ADLs?: No Communication: Independent Dressing (OT): Needs assistance Is this a change from baseline?: Pre-admission baseline Grooming: Needs assistance Is this a change from baseline?: Pre-admission baseline Feeding: Independent Bathing: Needs assistance Is this a change from baseline?: Pre-admission baseline Toileting: Needs assistance Is this a change from baseline?: Pre-admission baseline In/Out Bed: Needs assistance Is this a change from baseline?: Pre-admission baseline Walks in Home: Needs assistance Is this a change from baseline?: Pre-admission baseline Does the patient have difficulty walking or climbing stairs?: Yes Weakness of Legs: Both Weakness of Arms/Hands: Both  Permission Sought/Granted Permission sought to share information with : Oceanographer granted to share information with : Yes, Verbal Permission Granted     Permission granted to share info w AGENCY: SNFs        Emotional Assessment       Orientation: : Oriented to Self Alcohol / Substance Use: Not Applicable Psych Involvement: No (comment)  Admission diagnosis:  Pleural effusion [J90] Acute hypoxemic respiratory failure (HCC) [J96.01] Sepsis, due to unspecified organism, unspecified whether acute organ dysfunction present [facility]) [A41.9] Patient Active Problem  List   Diagnosis Date Noted  . Acute hypoxemic respiratory failure (HCC) 08/13/2020  . Altered mental status   . Dysarthria    . TIA (transient ischemic attack) 05/28/2020  . Cough 01/24/2012  . DYSPNEA 07/20/2009  . HYPERLIPIDEMIA 07/18/2009  . MITRAL REGURGITATION, 0 (MILD) 07/18/2009  . Primary hypertension 07/18/2009  . CAD, NATIVE VESSEL 07/18/2009   PCP:  Elfredia Nevins, MD Pharmacy:   [facility] - [place], [place] - [id number]  [place] [address] Phone: [phone] Fax: [phone]     Social Determinants of Health (SDOH) Interventions    Readmission Risk Interventions Readmission Risk Prevention Plan 08/14/2020  Medication Screening Complete  Transportation Screening Complete  Some recent data might be hidden

## 2020-08-14 NOTE — Plan of Care (Signed)
  Problem: Acute Rehab PT Goals(only PT should resolve) Goal: Pt Will Go Supine/Side To Sit Outcome: Progressing Flowsheets (Taken 08/14/2020 1216) Pt will go Supine/Side to Sit:  with moderate assist  with minimal assist Goal: Patient Will Transfer Sit To/From Stand Outcome: Progressing Flowsheets (Taken 08/14/2020 1216) Patient will transfer sit to/from stand: with minimal assist Goal: Pt Will Transfer Bed To Chair/Chair To Bed Outcome: Progressing Flowsheets (Taken 08/14/2020 1216) Pt will Transfer Bed to Chair/Chair to Bed: with min assist Goal: Pt Will Ambulate Outcome: Progressing Flowsheets (Taken 08/14/2020 1216) Pt will Ambulate:  25 feet  with minimal assist  with moderate assist  with rolling walker   12:16 PM, 08/14/20 Ocie Bob, MPT Physical Therapist with [facility] [phone] office [phone] mobile phone

## 2020-08-14 NOTE — Evaluation (Signed)
Occupational Therapy Evaluation Patient Details Name: [name] MRN: [id number] DOB: [birth year] Today's Date: 08/14/2020    History of Present Illness HPI: [name] is a 87 y.o. female with medical history significant for asthma, hyperlipidemia, hypertension, prior TIA, DM2, mitral regurgitation, GERD, anxiety, and osteoarthritis who presented to the ED with complaints of some weakness as well as right flank pain and chest pain that began several days ago.  She has also been noted to have some mild altered mentation over the last 3 days.  She has been noted to have cough productive of greenish sputum as well as some mild fevers.  Patient lives at home by herself and has been falling more at home over the last several days even despite using her walker.  Her son lives just behind her home and checks up on her regularly and has to make frequent visits to the home in order to pick her up every time she falls.  No significant abdominal pain, nausea, vomiting, or diarrhea no other events noted.   Clinical Impression   Pt agreeable to OT/PT co-evaluation this date. Pt able to complete sit to stand with Mod A. Pt demonstrates general UE weakness and limited A/ROM for shoulder flexion. Pt required mod A for stand pivot transfer from EOB to chair. Pt difficult to understand at times due to somewhat slurred speech. While seated without O2 via nasal cannula pt saturation was 88%. Pt placed back on ~2 L nasal cannula of O2 with saturation levels returning to the 90s. Pt will benefit from continued Ot in the hopital and recommended venue below to increase strength, balance, endurance, and ROM for safe ADL's.     Follow Up Recommendations  SNF    Equipment Recommendations  None recommended by OT           Precautions / Restrictions Precautions Precautions: Fall Restrictions Weight Bearing Restrictions: No      Mobility Bed Mobility                    Transfers Overall  transfer level: Needs assistance Equipment used: Rolling walker (2 wheeled) Transfers: Sit to/from [employer] Sit to Stand: Mod assist Stand pivot transfers: Mod assist       General transfer comment: Mod- max A for ambulation via short steps at EOB.    Balance Overall balance assessment: Needs assistance         Standing balance support: Bilateral upper extremity supported;During functional activity Standing balance-Leahy Scale: Poor Standing balance comment: poor                           ADL either performed or assessed with clinical judgement   ADL Overall ADL's : Needs assistance/impaired Eating/Feeding: Modified independent Eating/Feeding Details (indicate cue type and reason): Able to eat seated at chair ; Mod I assist due to extended time.   Grooming Details (indicate cue type and reason): Able to open soap container with extended time.                                     Vision Baseline Vision/History: Wears glasses;Cataracts Wears Glasses: Reading only                  Pertinent Vitals/Pain Pain Assessment: Faces Faces Pain Scale: Hurts little more Pain Location: Low back Pain Descriptors / Indicators: Guarding  Pain Intervention(s): Monitored during session     Hand Dominance Right   Extremity/Trunk Assessment Upper Extremity Assessment Upper Extremity Assessment: Generalized weakness (Bilateral A/ROM shoulder flexion limitiatoin to ~165*. 3-/5 MMT bilateral shoulder flexion. 4/5 bilateral elbow flexion.)   Lower Extremity Assessment Lower Extremity Assessment: Defer to PT evaluation   Cervical / Trunk Assessment Cervical / Trunk Assessment: Normal   Communication Communication Communication: Expressive difficulties;Other (comment) (slurred speech)   Cognition Arousal/Alertness: Awake/alert Behavior During Therapy: WFL for tasks assessed/performed Overall Cognitive Status: No family/caregiver present  to determine baseline cognitive functioning                                 General Comments: Slowed repsonse to one step commands.                    Home Living Family/patient expects to be discharged to:: Private residence Living Arrangements: Alone Available Help at Discharge: Family Type of Home: House Home Access: Ramped entrance     Home Layout: One level     Bathroom Shower/Tub: Walk-in shower;Curtain   Bathroom Toilet: Handicapped height Bathroom Accessibility: Yes   Home Equipment: Environmental consultant - 4 wheels;Walker - 2 wheels;Grab bars - tub/shower;Bedside commode;Tub bench   Additional Comments: Pt difficult to understand at times and may be a poor historian due to altered mental status. Difficult to obtain full history for these reasons. Some history taken form recent admission.      Prior Functioning/Environment Level of Independence: Needs assistance  Gait / Transfers Assistance Needed: ambulates with RW and Rollator household distances. ADL's / Homemaking Assistance Needed: independent BADLs per pt report;  assistance for grocery shopping and meal set up per document review from recent visit.            OT Problem List: Decreased strength;Decreased range of motion;Decreased activity tolerance;Impaired balance (sitting and/or standing)      OT Treatment/Interventions: Self-care/ADL training;Therapeutic exercise;Therapeutic activities;Patient/family education;Balance training    OT Goals(Current goals can be found in the care plan section) Acute Rehab OT Goals Patient Stated Goal: return home with family to assist OT Goal Formulation: With patient Time For Goal Achievement: 08/28/20 Potential to Achieve Goals: Fair  OT Frequency: Min 2X/week   Barriers to D/C:            Co-evaluation PT/OT/SLP Co-Evaluation/Treatment: Yes Reason for Co-Treatment: To address functional/ADL transfers   OT goals addressed during session: ADL's and  self-care;Strengthening/ROM                       End of Session Equipment Utilized During Treatment: Rolling walker, Oxygen   Activity Tolerance: Patient tolerated treatment well Patient left: with chair alarm set;with call bell/phone within reach;in chair  OT Visit Diagnosis: Unsteadiness on feet (R26.81);Repeated falls (R29.6);Muscle weakness (generalized) (M62.81);History of falling (Z91.81)                Time: 0865-7846 OT Time Calculation (min): 13 min Charges:  OT General Charges $OT Visit: 1 Visit OT Evaluation $OT Eval Low Complexity: 1 Low  Shanteria Laye OT, MOT   Danie Chandler 08/14/2020, 11:38 AM

## 2020-08-14 NOTE — [place] FL2 (Signed)
[place] MEDICAID FL2 LEVEL OF CARE SCREENING TOOL     IDENTIFICATION  Patient Name: [name] Birthdate: Mar 11, 1926 Sex: female Admission Date (Current Location): 08/13/2020  [facility] and [place] Number:  [employer] and Address:  [facility],  [address] [place] [address]      Provider Number: [id number]  Attending Physician Name and Address:  Erick Blinks, DO  Relative Name and Phone Number:       Current Level of Care: Hospital Recommended Level of Care: Skilled Nursing Facility Prior Approval Number:    Date Approved/Denied:   PASRR Number:    Discharge Plan: SNF    Current Diagnoses: Patient Active Problem List   Diagnosis Date Noted  . Acute hypoxemic respiratory failure (HCC) 08/13/2020  . Altered mental status   . Dysarthria   . TIA (transient ischemic attack) 05/28/2020  . Cough 01/24/2012  . DYSPNEA 07/20/2009  . HYPERLIPIDEMIA 07/18/2009  . MITRAL REGURGITATION, 0 (MILD) 07/18/2009  . Primary hypertension 07/18/2009  . CAD, NATIVE VESSEL 07/18/2009    Orientation RESPIRATION BLADDER Height & Weight     Self  Normal Incontinent Weight: 154 lb (69.9 kg) Height:  5' (152.4 cm)  BEHAVIORAL SYMPTOMS/MOOD NEUROLOGICAL BOWEL NUTRITION STATUS      Continent Diet (see dc summary)  AMBULATORY STATUS COMMUNICATION OF NEEDS Skin   Extensive Assist Verbally Skin abrasions (L Elbow)                       Personal Care Assistance Level of Assistance  Bathing,Feeding,Dressing Bathing Assistance: Limited assistance Feeding assistance: Independent Dressing Assistance: Limited assistance     Functional Limitations Info  Sight,Hearing,Speech Sight Info: Adequate Hearing Info: Adequate Speech Info: Adequate    SPECIAL CARE FACTORS FREQUENCY  PT (By licensed PT),OT (By licensed OT)     PT Frequency: 5x week OT Frequency: 3x week            Contractures Contractures Info: Not present    Additional  Factors Info  Code Status,Allergies,Psychotropic Code Status Info: DNR Allergies Info: Gabapentin, Morphine and related, Percocet, Povidone-Iodine, Prednisone Psychotropic Info: Xanax         Current Medications (08/14/2020):  This is the current hospital active medication list Current Facility-Administered Medications  Medication Dose Route Frequency Provider Last Rate Last Admin  . 0.9 %  sodium chloride infusion   Intravenous Continuous Maurilio Lovely D, DO 75 mL/hr at 08/14/20 0210 New Bag at 08/14/20 0210  . acetaminophen (TYLENOL) tablet 650 mg  650 mg Oral Q6H PRN Maurilio Lovely D, DO   650 mg at 08/14/20 1425   Or  . acetaminophen (TYLENOL) suppository 650 mg  650 mg Rectal Q6H PRN Sherryll Burger, Pratik D, DO      . ALPRAZolam Prudy Feeler) tablet 2 mg  2 mg Oral TID PRN Sherryll Burger, Pratik D, DO   2 mg at 08/13/20 2246  . aspirin tablet 325 mg  325 mg Oral Daily Maurilio Lovely D, DO   325 mg at 08/14/20 8676  . ceFEPIme (MAXIPIME) 2 g in sodium chloride 0.9 % 100 mL IVPB  2 g Intravenous Q12H Hall, Scott A, RPH 200 mL/hr at 08/14/20 1442 2 g at 08/14/20 1442  . enoxaparin (LOVENOX) injection 40 mg  40 mg Subcutaneous Q24H Sherryll Burger, Pratik D, DO   40 mg at 08/14/20 1441  . fluticasone (FLONASE) 50 MCG/ACT nasal spray 1 spray  1 spray Each Nare Daily Sherryll Burger, Pratik D, DO  1 spray at 08/14/20 0929  . insulin aspart (novoLOG) injection 0-5 Units  0-5 Units Subcutaneous QHS Maurilio Lovely D, DO   2 Units at 08/13/20 2253  . insulin aspart (novoLOG) injection 0-9 Units  0-9 Units Subcutaneous TID WC Shah, Pratik D, DO   7 Units at 08/14/20 1157  . ipratropium-albuterol (DUONEB) 0.5-2.5 (3) MG/3ML nebulizer solution 3 mL  3 mL Nebulization Q6H PRN Sherryll Burger, Pratik D, DO      . labetalol (NORMODYNE) injection 10 mg  10 mg Intravenous Q2H PRN Sherryll Burger, Pratik D, DO      . ondansetron (ZOFRAN) tablet 4 mg  4 mg Oral Q6H PRN Sherryll Burger, Pratik D, DO       Or  . ondansetron (ZOFRAN) injection 4 mg  4 mg Intravenous Q6H PRN Sherryll Burger, Pratik D, DO       . pantoprazole (PROTONIX) EC tablet 40 mg  40 mg Oral Daily Sherryll Burger, Pratik D, DO   40 mg at 08/14/20 4270  . vancomycin (VANCOCIN) IVPB 1000 mg/200 mL premix  1,000 mg Intravenous Q24H Valrie Hart A, RPH 200 mL/hr at 08/14/20 1205 1,000 mg at 08/14/20 1205     Discharge Medications: Please see discharge summary for a list of discharge medications.  Relevant Imaging Results:  Relevant Lab Results:   Additional Information SSN: [id number]  Elliot Gault, LCSW

## 2020-08-14 NOTE — Progress Notes (Signed)
Inpatient Diabetes Program Recommendations  AACE/ADA: New Consensus Statement on Inpatient Glycemic Control (2015)  Target Ranges:  Prepandial:   less than 140 mg/dL      Peak postprandial:   less than 180 mg/dL (1-2 hours)      Critically ill patients:  140 - 180 mg/dL   Lab Results  Component Value Date   GLUCAP 231 (H) 08/14/2020   HGBA1C 6.6 (H) 08/13/2020    Review of Glycemic Control Results for [name], [name] (MRN [id number]) as of 08/14/2020 16:21  Ref. Range 08/13/2020 18:01 08/13/2020 22:43 08/14/2020 07:30 08/14/2020 11:05 08/14/2020 16:00  Glucose-Capillary Latest Ref Range: 70 - 99 mg/dL 160 (H) 109 (H) 323 (H) 328 (H) 231 (H)   Diabetes history: DM 2 Outpatient Diabetes medications:  Novolin 70/30 25 units bid Current orders for Inpatient glycemic control:  Novolog sensitive tid with meals and HS  Inpatient Diabetes Program Recommendations:    Please consider add Lantus 10 units daily and Novolog 2 units tid with meals (hold if patient eats less than 50% or NPO).    Thanks Beryl Meager, RN, BC-ADM Inpatient Diabetes Coordinator Pager [phone] (8a-5p)

## 2020-08-14 NOTE — Progress Notes (Signed)
PROGRESS NOTE    [name]  WUJ:[id number] DOB: December 05, 1925 DOA: 08/13/2020 PCP: Elfredia NevinsFusco, Lawrence, MD   Brief Narrative:   [name] is a 87 y.o. female with medical history significant for asthma, hyperlipidemia, hypertension, prior TIA, DM2, mitral regurgitation, GERD, anxiety, and osteoarthritis who presented to the ED with complaints of some weakness as well as right flank pain and chest pain that began several days ago.  Patient was admitted with sepsis secondary to suspected right-sided empyema and was started on cefepime and vancomycin empirically.  She was also noted to have acute metabolic encephalopathy secondary to this.  Assessment & Plan:   Active Problems:   Acute hypoxemic respiratory failure (HCC)   Sepsis likely secondary to possible right-sided empyema -Loculated fluid noted with compressive atelectasis to right lung field -Received fluid bolus in the ED, lactic acid 1.7 -Ultrasound thoracentesis today -Continue on cefepime and vancomycin as ordered -MRSA PCR negative -COVID swab negative -Urine analysis with pyuria, but no frank UTI  Acute metabolic encephalopathy secondary to above -In the setting of what appears to be mild cognitive impairment versus dementia -Continue to monitor closely -Urine analysis with pyuria  Acute hypoxemic respiratory failure secondary to above -Wean as tolerated based on treatment as above -Initially on room air at home -Incentive spirometry  History of hypertension -Hold home antihypertensive agents and monitor with potential sepsis physiology -Labetalol as needed for significant elevations  Type 2 diabetes -Hemoglobin A1c 6.0% on 05/2020 -Carb modified diet -SSI  Dyslipidemia -Appears to be diet controlled  Frequent falls/debility -PT/OT evaluation with noted need for SNF -Fall precautions  GERD -PPI  History of anxiety -As needed anxiolytics  History of asthma/allergic  rhinitis -Bronchodilators as needed for wheezing or shortness of breath   DVT prophylaxis:Lovenox Code Status: DNR Family Communication: Discussed with son 4/25 Disposition Plan:  Status is: Inpatient  Remains inpatient appropriate because:Altered mental status, IV treatments appropriate due to intensity of illness or inability to take PO and Inpatient level of care appropriate due to severity of illness   Dispo: The patient is from: Home              Anticipated d/c is to: SNF              Patient currently is not medically stable to d/c.   Difficult to place patient No   Consultants:   None  Procedures:   US Thoracentesis 4/25  Antimicrobials:  Anti-infectives (From admission, onward)   Start     Dose/Rate Route Frequency Ordered Stop   08/14/20 1200  vancomycin (VANCOCIN) IVPB 1000 mg/200 mL premix        1,000 mg 200 mL/hr over 60 Minutes Intravenous Every 24 hours 08/13/20 1838     08/14/20 0300  ceFEPIme (MAXIPIME) 2 g in sodium chloride 0.9 % 100 mL IVPB        2 g 200 mL/hr over 30 Minutes Intravenous Every 12 hours 08/13/20 1836     08/13/20 1230  ceFEPIme (MAXIPIME) 2 g in sodium chloride 0.9 % 100 mL IVPB        2 g 200 mL/hr over 30 Minutes Intravenous  Once 08/13/20 1223 08/13/20 1407   08/13/20 1230  metroNIDAZOLE (FLAGYL) IVPB 500 mg        500 mg 100 mL/hr over 60 Minutes Intravenous  Once 08/13/20 1223 08/13/20 1508   08/13/20 1230  vancomycin (VANCOCIN) IVPB 1000 mg/200 mL premix        1,000 mg  200 mL/hr over 60 Minutes Intravenous  Once 08/13/20 1223 08/13/20 1536       Subjective: Patient seen and evaluated today with no new acute complaints or concerns. No acute concerns or events noted overnight.  Objective: Vitals:   08/13/20 1545 08/13/20 1713 08/13/20 2137 08/14/20 0653  BP: 135/76 (!) 133/56 139/60 (!) 128/56  Pulse: (!) 108 (!) 104 100 100  Resp: (!) 31 18 (!) 22 18  Temp:  98.2 F (36.8 C) 98.7 F (37.1 C) 97.7 F (36.5 C)   TempSrc:  Oral Oral   SpO2: 94% 96% 97% 95%  Weight:      Height:        Intake/Output Summary (Last 24 hours) at 08/14/2020 1201 Last data filed at 08/14/2020 0900 Gross per 24 hour  Intake 3034.11 ml  Output 1100 ml  Net 1934.11 ml   Filed Weights   08/13/20 0946  Weight: 69.9 kg    Examination:  General exam: Appears calm and comfortable, appears confused Respiratory system: Clear to auscultation. Respiratory effort normal. On [place] oxygen Cardiovascular system: S1 & S2 heard, RRR.  Gastrointestinal system: Abdomen is soft Central nervous system: Alert and awake Extremities: No edema Skin: No significant lesions noted Psychiatry: Flat affect.    Data Reviewed: I have personally reviewed following labs and imaging studies  CBC: Recent Labs  Lab 08/13/20 1041 08/14/20 0520  WBC 27.9* 28.0*  NEUTROABS 24.8*  --   HGB 13.2 11.3*  HCT 40.7 35.0*  MCV 106.0* 105.7*  PLT 321 325   Basic Metabolic Panel: Recent Labs  Lab 08/13/20 1041 08/14/20 0520  NA 138 137  K 3.6 3.5  CL 96* 100  CO2 29 27  GLUCOSE 254* 172*  BUN 22 17  CREATININE 0.83 0.63  CALCIUM 8.8* 8.3*  MG  --  1.9   GFR: Estimated Creatinine Clearance: 39.2 mL/min (by C-G formula based on SCr of 0.63 mg/dL). Liver Function Tests: Recent Labs  Lab 08/13/20 1041  AST 60*  ALT 55*  ALKPHOS 128*  BILITOT 1.7*  PROT 7.0  ALBUMIN 2.9*   Recent Labs  Lab 08/13/20 1041  LIPASE 24   No results for input(s): AMMONIA in the last 168 hours. Coagulation Profile: No results for input(s): INR, PROTIME in the last 168 hours. Cardiac Enzymes: No results for input(s): CKTOTAL, CKMB, CKMBINDEX, TROPONINI in the last 168 hours. BNP (last 3 results) No results for input(s): PROBNP in the last 8760 hours. HbA1C: Recent Labs    08/13/20 1041  HGBA1C 6.6*   CBG: Recent Labs  Lab 08/13/20 1801 08/13/20 2243 08/14/20 0730 08/14/20 1105  GLUCAP 211* 205* 171* 328*   Lipid Profile: No  results for input(s): CHOL, HDL, LDLCALC, TRIG, CHOLHDL, LDLDIRECT in the last 72 hours. Thyroid Function Tests: No results for input(s): TSH, T4TOTAL, FREET4, T3FREE, THYROIDAB in the last 72 hours. Anemia Panel: No results for input(s): VITAMINB12, FOLATE, FERRITIN, TIBC, IRON, RETICCTPCT in the last 72 hours. Sepsis Labs: Recent Labs  Lab 08/13/20 1308  LATICACIDVEN 1.7    Recent Results (from the past 240 hour(s))  Culture, blood (Routine X 2) w Reflex to ID Panel     Status: None (Preliminary result)   Collection Time: 08/13/20 12:54 PM   Specimen: BLOOD RIGHT WRIST  Result Value Ref Range Status   Specimen Description BLOOD RIGHT WRIST  Final   Special Requests   Final    Blood Culture results may not be optimal due to an inadequate volume  of blood received in culture bottles BOTTLES DRAWN AEROBIC AND ANAEROBIC Performed at [facility], [address], [place], [place] [address]    Culture PENDING  Incomplete   Report Status PENDING  Incomplete  Culture, blood (Routine X 2) w Reflex to ID Panel     Status: None (Preliminary result)   Collection Time: 08/13/20  1:23 PM   Specimen: BLOOD RIGHT ARM  Result Value Ref Range Status   Specimen Description BLOOD RIGHT ARM  Final   Special Requests   Final    Blood Culture results may not be optimal due to an inadequate volume of blood received in culture bottles BOTTLES DRAWN AEROBIC AND ANAEROBIC Performed at [facility], [address], [place], [place] [address]    Culture PENDING  Incomplete   Report Status PENDING  Incomplete  Resp Panel by RT-PCR (Flu A&B, Covid) Nasopharyngeal Swab     Status: None   Collection Time: 08/13/20  2:05 PM   Specimen: Nasopharyngeal Swab; Nasopharyngeal(NP) swabs in vial transport medium  Result Value Ref Range Status   SARS Coronavirus 2 by RT PCR NEGATIVE NEGATIVE Final    Comment: (NOTE) SARS-CoV-2 target nucleic acids are NOT DETECTED.  The SARS-CoV-2 RNA is generally detectable in  upper respiratory specimens during the acute phase of infection. The lowest concentration of SARS-CoV-2 viral copies this assay can detect is 138 copies/mL. A negative result does not preclude SARS-Cov-2 infection and should not be used as the sole basis for treatment or other patient management decisions. A negative result may occur with  improper specimen collection/handling, submission of specimen other than nasopharyngeal swab, presence of viral mutation(s) within the areas targeted by this assay, and inadequate number of viral copies(<138 copies/mL). A negative result must be combined with clinical observations, patient history, and epidemiological information. The expected result is Negative.  Fact Sheet for Patients:  BloggerCourse.com  Fact Sheet for Healthcare Providers:  SeriousBroker.it  This test is no t yet approved or cleared by the [place] FDA and  has been authorized for detection and/or diagnosis of SARS-CoV-2 by FDA under an Emergency Use Authorization (EUA). This EUA will remain  in effect (meaning this test can be used) for the duration of the COVID-19 declaration under Section 564(b)(1) of the Act, 21 U.S.C.section 360bbb-3(b)(1), unless the authorization is terminated  or revoked sooner.       Influenza A by PCR NEGATIVE NEGATIVE Final   Influenza B by PCR NEGATIVE NEGATIVE Final    Comment: (NOTE) The Xpert Xpress SARS-CoV-2/FLU/RSV plus assay is intended as an aid in the diagnosis of influenza from Nasopharyngeal swab specimens and should not be used as a sole basis for treatment. Nasal washings and aspirates are unacceptable for Xpert Xpress SARS-CoV-2/FLU/RSV testing.  Fact Sheet for Patients: BloggerCourse.com  Fact Sheet for Healthcare Providers: SeriousBroker.it  This test is not yet approved or cleared by the [place] FDA and has been  authorized for detection and/or diagnosis of SARS-CoV-2 by FDA under an Emergency Use Authorization (EUA). This EUA will remain in effect (meaning this test can be used) for the duration of the COVID-19 declaration under Section 564(b)(1) of the Act, 21 U.S.C. section 360bbb-3(b)(1), unless the authorization is terminated or revoked.  Performed at [facility], [address] [place], [place] [address]   MRSA PCR Screening     Status: None   Collection Time: 08/13/20  4:58 PM   Specimen: Nasal Mucosa; Nasopharyngeal  Result Value Ref Range Status   MRSA  by PCR NEGATIVE NEGATIVE Final    Comment:        The GeneXpert MRSA Assay (FDA approved for NASAL specimens only), is one component of a comprehensive MRSA colonization surveillance program. It is not intended to diagnose MRSA infection nor to guide or monitor treatment for MRSA infections. Performed at [facility], [address], [place], [place] [address]          Radiology Studies: CT Angio Chest PE W/Cm &/Or Wo Cm  Result Date: 08/13/2020 CLINICAL DATA:  Chest pain, right flank pain, weakness EXAM: CT ANGIOGRAPHY CHEST CT ABDOMEN AND PELVIS WITH CONTRAST TECHNIQUE: Multidetector CT imaging of the chest was performed using the standard protocol during bolus administration of intravenous contrast. Multiplanar CT image reconstructions and MIPs were obtained to evaluate the vascular anatomy. Multidetector CT imaging of the abdomen and pelvis was performed using the standard protocol during bolus administration of intravenous contrast. CONTRAST:  OMNIPAQUE IOHEXOL 350 MG/ML SOLN COMPARISON:  CT abdomen pelvis 11/21/2003.  Same day chest x-ray. FINDINGS: CTA CHEST FINDINGS Cardiovascular: Satisfactory opacification of pulmonary arteries. No filling defect evident to the segmental branch level to suggest pulmonary embolism. Main pulmonary trunk is normal in caliber. Thoracic aorta is nonaneurysmal. Atherosclerotic  calcifications of the aorta and coronary arteries. Heart size within normal limits. No pericardial effusion. Mediastinum/Nodes: No axillary, mediastinal, or hilar lymphadenopathy. Thyroid, trachea, and esophagus within normal limits. Lungs/Pleura: Small to moderate sized loculated pleural effusion along the posterior and lateral aspects of the right hemithorax. Fluid also tracks into the minor fissure. There is associated compressive atelectasis within the right lower lobe. Left lung is clear. No left-sided pleural effusion. No pneumothorax. Musculoskeletal: Chronic displaced posterior left twelfth rib fracture. No acute osseous findings. No chest wall abnormality. Review of the MIP images confirms the above findings. CT ABDOMEN and PELVIS FINDINGS Hepatobiliary: No focal liver abnormality is seen. Status post cholecystectomy. No biliary dilatation. Pancreas: Unremarkable. No pancreatic ductal dilatation or surrounding inflammatory changes. Spleen: Normal in size without focal abnormality. Adrenals/Urinary Tract: Unremarkable adrenal glands. Two right renal cysts are noted. Kidneys have otherwise symmetric enhancement. There is excreted contrast within the bilateral renal collecting systems. No definite renal stone. No hydronephrosis. Bilateral ureters are unremarkable. Urinary bladder within normal limits. Stomach/Bowel: Stomach is within normal limits. Scattered colonic diverticulosis. No evidence of bowel wall thickening, distention, or inflammatory changes. Vascular/Lymphatic: Atherosclerotic calcification throughout the aortoiliac axis. No aneurysm. No abdominopelvic lymphadenopathy. Reproductive: Status post hysterectomy. No adnexal masses. Other: No free fluid. No abdominopelvic fluid collection. No pneumoperitoneum. Musculoskeletal: Prior lumbar fusion. Mild perihardware lucency associated with the right transpedicular screw at L1 (series 8, image 78). 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 Severe  arthropathy of the right hip. No acute osseous findings. Review of the MIP images confirms the above findings. IMPRESSION: 1. No evidence of pulmonary embolism. 2. Small-to-moderate loculated right-sided pleural effusion with associated compressive atelectasis. 3. No acute findings within the abdomen or pelvis. 4. Prior lumbar fusion with mild perihardware lucency associated with the right transpedicular screw at L1, suggestive of loosening. 5. Colonic diverticulosis without evidence of diverticulitis. 6. Aortic and coronary artery atherosclerosis (ICD10-I70.0). Electronically Signed   By: Duanne Guess D.O.   On: 08/13/2020 14:22   CT Abdomen Pelvis W Contrast  Result Date: 08/13/2020 CLINICAL DATA:  Chest pain, right flank pain, weakness EXAM: CT ANGIOGRAPHY CHEST CT ABDOMEN AND PELVIS WITH CONTRAST TECHNIQUE: Multidetector CT imaging of the chest was performed using the standard protocol during bolus administration  of intravenous contrast. Multiplanar CT image reconstructions and MIPs were obtained to evaluate the vascular anatomy. Multidetector CT imaging of the abdomen and pelvis was performed using the standard protocol during bolus administration of intravenous contrast. CONTRAST:  OMNIPAQUE IOHEXOL 350 MG/ML SOLN COMPARISON:  CT abdomen pelvis 11/21/2003.  Same day chest x-ray. FINDINGS: CTA CHEST FINDINGS Cardiovascular: Satisfactory opacification of pulmonary arteries. No filling defect evident to the segmental branch level to suggest pulmonary embolism. Main pulmonary trunk is normal in caliber. Thoracic aorta is nonaneurysmal. Atherosclerotic calcifications of the aorta and coronary arteries. Heart size within normal limits. No pericardial effusion. Mediastinum/Nodes: No axillary, mediastinal, or hilar lymphadenopathy. Thyroid, trachea, and esophagus within normal limits. Lungs/Pleura: Small to moderate sized loculated pleural effusion along the posterior and lateral aspects of the right  hemithorax. Fluid also tracks into the minor fissure. There is associated compressive atelectasis within the right lower lobe. Left lung is clear. No left-sided pleural effusion. No pneumothorax. Musculoskeletal: Chronic displaced posterior left twelfth rib fracture. No acute osseous findings. No chest wall abnormality. Review of the MIP images confirms the above findings. CT ABDOMEN and PELVIS FINDINGS Hepatobiliary: No focal liver abnormality is seen. Status post cholecystectomy. No biliary dilatation. Pancreas: Unremarkable. No pancreatic ductal dilatation or surrounding inflammatory changes. Spleen: Normal in size without focal abnormality. Adrenals/Urinary Tract: Unremarkable adrenal glands. Two right renal cysts are noted. Kidneys have otherwise symmetric enhancement. There is excreted contrast within the bilateral renal collecting systems. No definite renal stone. No hydronephrosis. Bilateral ureters are unremarkable. Urinary bladder within normal limits. Stomach/Bowel: Stomach is within normal limits. Scattered colonic diverticulosis. No evidence of bowel wall thickening, distention, or inflammatory changes. Vascular/Lymphatic: Atherosclerotic calcification throughout the aortoiliac axis. No aneurysm. No abdominopelvic lymphadenopathy. Reproductive: Status post hysterectomy. No adnexal masses. Other: No free fluid. No abdominopelvic fluid collection. No pneumoperitoneum. Musculoskeletal: Prior lumbar fusion. Mild perihardware lucency associated with the right transpedicular screw at L1 (series 8, image 78). 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 Severe arthropathy of the right hip. No acute osseous findings. Review of the MIP images confirms the above findings. IMPRESSION: 1. No evidence of pulmonary embolism. 2. Small-to-moderate loculated right-sided pleural effusion with associated compressive atelectasis. 3. No acute findings within the abdomen or pelvis. 4. Prior lumbar fusion with mild  perihardware lucency associated with the right transpedicular screw at L1, suggestive of loosening. 5. Colonic diverticulosis without evidence of diverticulitis. 6. Aortic and coronary artery atherosclerosis (ICD10-I70.0). Electronically Signed   By: Duanne Guess D.O.   On: 08/13/2020 14:22   DG Chest Port 1 View  Result Date: 08/13/2020 CLINICAL DATA:  Chest pain and weakness. EXAM: PORTABLE CHEST 1 VIEW COMPARISON:  05/28/2020 FINDINGS: Normal heart size. Aortic atherosclerosis. There is a new opacity overlying the right mid and lower lung concerning for loculated pleural effusion. Fluid tracking along the major fissure is noted within the perihilar right lung. Left lung appears clear. IMPRESSION: New opacity overlying the right mid and lower lung concerning for loculated pleural effusion. Electronically Signed   By: Signa Kell M.D.   On: 08/13/2020 11:13        Scheduled Meds: . aspirin  325 mg Oral Daily  . enoxaparin (LOVENOX) injection  40 mg Subcutaneous Q24H  . fluticasone  1 spray Each Nare Daily  . insulin aspart  0-5 Units Subcutaneous QHS  . insulin aspart  0-9 Units Subcutaneous TID WC  . pantoprazole  40 mg Oral Daily   Continuous Infusions: . sodium chloride 75 mL/hr at 08/14/20  0210  . ceFEPime (MAXIPIME) IV 2 g (08/14/20 0211)  . vancomycin       LOS: 1 day    Time spent: 35 minutes    Kaidan Harpster Hoover Brunette, DO [facility]  If 7PM-7AM, please contact night-coverage [email or web address] 08/14/2020, 12:01 PM

## 2020-08-15 ENCOUNTER — Encounter (HOSPITAL_COMMUNITY): Payer: Self-pay | Admitting: Internal Medicine

## 2020-08-15 DIAGNOSIS — Z7189 Other specified counseling: Secondary | ICD-10-CM

## 2020-08-15 DIAGNOSIS — Z515 Encounter for palliative care: Secondary | ICD-10-CM

## 2020-08-15 DIAGNOSIS — A419 Sepsis, unspecified organism: Principal | ICD-10-CM

## 2020-08-15 LAB — URINE CULTURE: Culture: NO GROWTH

## 2020-08-15 LAB — BASIC METABOLIC PANEL
Anion gap: 8 (ref 5–15)
BUN: 15 mg/dL (ref 8–23)
CO2: 25 mmol/L (ref 22–32)
Calcium: 7.9 mg/dL — ABNORMAL LOW (ref 8.9–10.3)
Chloride: 103 mmol/L (ref 98–111)
Creatinine, Ser: 0.67 mg/dL (ref 0.44–1.00)
GFR, Estimated: >60 mL/min (ref >60)
Glucose, Bld: 255 mg/dL — ABNORMAL HIGH (ref 70–99)
Potassium: 3.6 mmol/L (ref 3.5–5.1)
Sodium: 136 mmol/L (ref 135–145)

## 2020-08-15 LAB — GLUCOSE, CAPILLARY
Glucose-Capillary: 221 mg/dL — ABNORMAL HIGH (ref 70–99)
Glucose-Capillary: 236 mg/dL — ABNORMAL HIGH (ref 70–99)
Glucose-Capillary: 229 mg/dL — ABNORMAL HIGH (ref 70–99)
Glucose-Capillary: 211 mg/dL — ABNORMAL HIGH (ref 70–99)

## 2020-08-15 LAB — CBC
HCT: 35.9 % — ABNORMAL LOW (ref 36.0–46.0)
Hemoglobin: 11.4 g/dL — ABNORMAL LOW (ref 12.0–15.0)
MCH: 33.8 pg (ref 26.0–34.0)
MCHC: 31.8 g/dL (ref 30.0–36.0)
MCV: 106.5 fL — ABNORMAL HIGH (ref 80.0–100.0)
Platelets: 318 10*3/uL (ref 150–400)
RBC: 3.37 MIL/uL — ABNORMAL LOW (ref 3.87–5.11)
RDW: 12.3 % (ref 11.5–15.5)
WBC: 27.1 10*3/uL — ABNORMAL HIGH (ref 4.0–10.5)
nRBC: 0 % (ref 0.0–0.2)

## 2020-08-15 LAB — LEGIONELLA PNEUMOPHILA SEROGP 1 UR AG: L. pneumophila Serogp 1 Ur Ag: NEGATIVE

## 2020-08-15 LAB — MAGNESIUM: Magnesium: 2 mg/dL (ref 1.7–2.4)

## 2020-08-15 MED ORDER — LEVALBUTEROL HCL 0.63 MG/3ML IN NEBU: 0.6300 mg | INHALATION_SOLUTION | Freq: Four times a day (QID) | RESPIRATORY_TRACT | Status: DC | PRN

## 2020-08-15 MED ORDER — INSULIN GLARGINE 100 UNIT/ML ~~LOC~~ SOLN
10.0000 [IU] | Freq: Every day | SUBCUTANEOUS | Status: DC
  Administered 2020-08-15 – 2020-08-16 (×2): 10 [IU] via SUBCUTANEOUS
  Filled 2020-08-15 (×3): qty 0.1

## 2020-08-15 MED ORDER — IPRATROPIUM-ALBUTEROL 0.5-2.5 (3) MG/3ML IN SOLN: 3.0000 mL | Freq: Four times a day (QID) | RESPIRATORY_TRACT | Status: DC | PRN

## 2020-08-15 MED ORDER — INSULIN ASPART 100 UNIT/ML ~~LOC~~ SOLN
2.0000 [IU] | Freq: Three times a day (TID) | SUBCUTANEOUS | Status: DC
  Administered 2020-08-15 – 2020-08-16 (×4): 2 [IU] via SUBCUTANEOUS

## 2020-08-15 NOTE — Progress Notes (Signed)
Alert and oriented to person and self. C/o back pain and anxiety. Given Tylenol and Xanax and went to sleep. Continues on supplemental oxygen 1 1/2 liters. Mouth breathing so not easy to assess effect. Needs 1 person assist with adls. IV fluids and antibiotics infused and tolerated well. Purewick in use. Call bell within reach. Continue plan of care.

## 2020-08-15 NOTE — Progress Notes (Addendum)
PROGRESS NOTE    [name]  ZOX:[id number] DOB: July 10, 1925 DOA: 08/13/2020 PCP: Elfredia Nevins, MD   Brief Narrative:   [name] a 87 y.o.femalewith medical history significant forasthma, hyperlipidemia, hypertension,prior TIA, DM2,mitral regurgitation, GERD,anxiety,and osteoarthritiswho presented to the ED with complaints of some weakness as well as right flank pain and chest painthat began several days ago.  Patient was admitted with sepsis secondary to suspected right-sided empyema and was started on cefepime and vancomycin empirically.  She was also noted to have acute metabolic encephalopathy secondary to this.  She has had minimal fluid drained with thoracentesis on 4/25 and likely has ongoing loculated effusions that would need to be addressed.  Upon further discussion with the son at bedside, he states that she would not want any further aggressive care to include transfer for evaluation by CT surgery and states that home hospice is what she would want.  Palliative care consulted with plans for discharge to home with hospice once equipment delivered.  Assessment & Plan:   Active Problems:   Acute hypoxemic respiratory failure (HCC)   Sepsis likely secondary to possible right-sided empyema -Loculated fluid noted with compressive atelectasis to right lung field -Received fluid bolus in the ED, lactic acid 1.7 -Ultrasound thoracentesis with minimal fluid obtained -Continue on cefepime and vancomycin as ordered -MRSA PCR negative -COVID swab negative -Urine analysis with pyuria, but no frank UTI -Continues to have leukocytosis and option to repeat CT chest and likely transfer for CT surgery evaluation discussed with son at bedside, he declines and states that she would like to go home with hospice.  Palliative consulted.  Acute metabolic encephalopathy secondary to above -In the setting of what appears to be mild cognitive impairment versus  dementia -Continue to monitor closely -Urine analysis with pyuria, UCx no growth  Acute hypoxemic respiratory failure secondary to above -Wean as tolerated based on treatment as above -Initially on room air at home -Incentive spirometry  History of hypertension -Hold home antihypertensive agents and monitor with potential sepsis physiology -Labetalol as needed for significant elevations  Type 2 diabetes-elevated BG readings -Hemoglobin A1c 6.0% on 05/2020 -Carb modified diet -SSI to continue -Added Lantus and mealtime insulin today per diabetes coordinator recommendations  Dyslipidemia -Appears to be diet controlled  Frequent falls/debility -PT/OT evaluation with noted need for SNF, but now looking at home hospice -Fall precautions  GERD -PPI  History of anxiety -As needed anxiolytics  History of asthma/allergic rhinitis -Bronchodilators as needed for wheezing or shortness of breath   DVT prophylaxis:Lovenox Code Status: DNR Family Communication: Discussed with son 4/26 at bedside Disposition Plan:  Status is: Inpatient  Remains inpatient appropriate because:Altered mental status, IV treatments appropriate due to intensity of illness or inability to take PO and Inpatient level of care appropriate due to severity of illness   Dispo: The patient is from: Home  Anticipated d/c is to: Home with hospice  Patient currently is not medically stable to d/c.              Difficult to place patient No   Consultants:   Palliative care  Procedures:   US Thoracentesis 4/25  Antimicrobials:  Anti-infectives (From admission, onward)   Start     Dose/Rate Route Frequency Ordered Stop   08/14/20 1200  vancomycin (VANCOCIN) IVPB 1000 mg/200 mL premix        1,000 mg 200 mL/hr over 60 Minutes Intravenous Every 24 hours 08/13/20 1838     08/14/20 0300  ceFEPIme (MAXIPIME)  2 g in sodium chloride 0.9 % 100 mL IVPB        2 g 200  mL/hr over 30 Minutes Intravenous Every 12 hours 08/13/20 1836     08/13/20 1230  ceFEPIme (MAXIPIME) 2 g in sodium chloride 0.9 % 100 mL IVPB        2 g 200 mL/hr over 30 Minutes Intravenous  Once 08/13/20 1223 08/13/20 1407   08/13/20 1230  metroNIDAZOLE (FLAGYL) IVPB 500 mg        500 mg 100 mL/hr over 60 Minutes Intravenous  Once 08/13/20 1223 08/13/20 1508   08/13/20 1230  vancomycin (VANCOCIN) IVPB 1000 mg/200 mL premix        1,000 mg 200 mL/hr over 60 Minutes Intravenous  Once 08/13/20 1223 08/13/20 1536       Subjective: Patient seen and evaluated today with no new acute complaints or concerns. No acute concerns or events noted overnight.  Objective: Vitals:   08/14/20 0653 08/14/20 1451 08/15/20 0515 08/15/20 0609  BP: (!) 128/56 108/69  111/66  Pulse: 100 100  100  Resp: 18 17  20   Temp: 97.7 F (36.5 C) 98.5 F (36.9 C)  98.1 F (36.7 C)  TempSrc:  Oral    SpO2: 95% 96% 94% 94%  Weight:      Height:        Intake/Output Summary (Last 24 hours) at 08/15/2020 1222 Last data filed at 08/15/2020 0900 Gross per 24 hour  Intake 3180.92 ml  Output 900 ml  Net 2280.92 ml   Filed Weights   08/13/20 0946  Weight: 69.9 kg    Examination:  General exam: Appears calm and comfortable, confused and hard of hearing Respiratory system: Clear to auscultation. Respiratory effort normal.  Nasal cannula oxygen Cardiovascular system: S1 & S2 heard, RRR.  Gastrointestinal system: Abdomen is soft Central nervous system: Alert and awake Extremities: No edema Skin: No significant lesions noted Psychiatry: Flat affect.    Data Reviewed: I have personally reviewed following labs and imaging studies  CBC: Recent Labs  Lab 08/13/20 1041 08/14/20 0520 08/15/20 0619  WBC 27.9* 28.0* 27.1*  NEUTROABS 24.8*  --   --   HGB 13.2 11.3* 11.4*  HCT 40.7 35.0* 35.9*  MCV 106.0* 105.7* 106.5*  PLT 321 325 318   Basic Metabolic Panel: Recent Labs  Lab 08/13/20 1041  08/14/20 0520 08/15/20 0619  NA 138 137 136  K 3.6 3.5 3.6  CL 96* 100 103  CO2 29 27 25   GLUCOSE 254* 172* 255*  BUN 22 17 15   CREATININE 0.83 0.63 0.67  CALCIUM 8.8* 8.3* 7.9*  MG  --  1.9 2.0   GFR: Estimated Creatinine Clearance: 39.2 mL/min (by C-G formula based on SCr of 0.67 mg/dL). Liver Function Tests: Recent Labs  Lab 08/13/20 1041  AST 60*  ALT 55*  ALKPHOS 128*  BILITOT 1.7*  PROT 7.0  ALBUMIN 2.9*   Recent Labs  Lab 08/13/20 1041  LIPASE 24   No results for input(s): AMMONIA in the last 168 hours. Coagulation Profile: No results for input(s): INR, PROTIME in the last 168 hours. Cardiac Enzymes: No results for input(s): CKTOTAL, CKMB, CKMBINDEX, TROPONINI in the last 168 hours. BNP (last 3 results) No results for input(s): PROBNP in the last 8760 hours. HbA1C: Recent Labs    08/13/20 1041  HGBA1C 6.6*   CBG: Recent Labs  Lab 08/14/20 1105 08/14/20 1600 08/14/20 2051 08/15/20 0724 08/15/20 1116  GLUCAP 328* 231* 247*  221* 236*   Lipid Profile: No results for input(s): CHOL, HDL, LDLCALC, TRIG, CHOLHDL, LDLDIRECT in the last 72 hours. Thyroid Function Tests: No results for input(s): TSH, T4TOTAL, FREET4, T3FREE, THYROIDAB in the last 72 hours. Anemia Panel: No results for input(s): VITAMINB12, FOLATE, FERRITIN, TIBC, IRON, RETICCTPCT in the last 72 hours. Sepsis Labs: Recent Labs  Lab 08/13/20 1308  LATICACIDVEN 1.7    Recent Results (from the past 240 hour(s))  Culture, blood (Routine X 2) w Reflex to ID Panel     Status: None (Preliminary result)   Collection Time: 08/13/20 12:54 PM   Specimen: BLOOD RIGHT WRIST  Result Value Ref Range Status   Specimen Description BLOOD RIGHT WRIST  Final   Special Requests   Final    Blood Culture results may not be optimal due to an inadequate volume of blood received in culture bottles BOTTLES DRAWN AEROBIC AND ANAEROBIC   Culture   Final    NO GROWTH 2 DAYS Performed at [facility],  [address] [place], [place] [address]    Report Status PENDING  Incomplete  Culture, blood (Routine X 2) w Reflex to ID Panel     Status: None (Preliminary result)   Collection Time: 08/13/20  1:23 PM   Specimen: BLOOD RIGHT ARM  Result Value Ref Range Status   Specimen Description BLOOD RIGHT ARM  Final   Special Requests   Final    Blood Culture results may not be optimal due to an inadequate volume of blood received in culture bottles BOTTLES DRAWN AEROBIC AND ANAEROBIC   Culture   Final    NO GROWTH 2 DAYS Performed at [facility], [address] [place], [place] [address]    Report Status PENDING  Incomplete  Resp Panel by RT-PCR (Flu A&B, Covid) Nasopharyngeal Swab     Status: None   Collection Time: 08/13/20  2:05 PM   Specimen: Nasopharyngeal Swab; Nasopharyngeal(NP) swabs in vial transport medium  Result Value Ref Range Status   SARS Coronavirus 2 by RT PCR NEGATIVE NEGATIVE Final    Comment: (NOTE) SARS-CoV-2 target nucleic acids are NOT DETECTED.  The SARS-CoV-2 RNA is generally detectable in upper respiratory specimens during the acute phase of infection. The lowest concentration of SARS-CoV-2 viral copies this assay can detect is 138 copies/mL. A negative result does not preclude SARS-Cov-2 infection and should not be used as the sole basis for treatment or other patient management decisions. A negative result may occur with  improper specimen collection/handling, submission of specimen other than nasopharyngeal swab, presence of viral mutation(s) within the areas targeted by this assay, and inadequate number of viral copies(<138 copies/mL). A negative result must be combined with clinical observations, patient history, and epidemiological information. The expected result is Negative.  Fact Sheet for Patients:  BloggerCourse.comhttps://www.fda.gov/media/152166/download  Fact Sheet for Healthcare Providers:  SeriousBroker.ithttps://www.fda.gov/media/152162/download  This test is no t yet approved  or cleared by the [place] FDA and  has been authorized for detection and/or diagnosis of SARS-CoV-2 by FDA under an Emergency Use Authorization (EUA). This EUA will remain  in effect (meaning this test can be used) for the duration of the COVID-19 declaration under Section 564(b)(1) of the Act, 21 U.S.C.section 360bbb-3(b)(1), unless the authorization is terminated  or revoked sooner.       Influenza A by PCR NEGATIVE NEGATIVE Final   Influenza B by PCR NEGATIVE NEGATIVE Final    Comment: (NOTE) The Xpert Xpress SARS-CoV-2/FLU/RSV plus assay is intended as an  aid in the diagnosis of influenza from Nasopharyngeal swab specimens and should not be used as a sole basis for treatment. Nasal washings and aspirates are unacceptable for Xpert Xpress SARS-CoV-2/FLU/RSV testing.  Fact Sheet for Patients: BloggerCourse.com  Fact Sheet for Healthcare Providers: SeriousBroker.it  This test is not yet approved or cleared by the [place] FDA and has been authorized for detection and/or diagnosis of SARS-CoV-2 by FDA under an Emergency Use Authorization (EUA). This EUA will remain in effect (meaning this test can be used) for the duration of the COVID-19 declaration under Section 564(b)(1) of the Act, 21 U.S.C. section 360bbb-3(b)(1), unless the authorization is terminated or revoked.  Performed at [facility], [address] [place], [place] [address]   MRSA PCR Screening     Status: None   Collection Time: 08/13/20  4:58 PM   Specimen: Nasal Mucosa; Nasopharyngeal  Result Value Ref Range Status   MRSA by PCR NEGATIVE NEGATIVE Final    Comment:        The GeneXpert MRSA Assay (FDA approved for NASAL specimens only), is one component of a comprehensive MRSA colonization surveillance program. It is not intended to diagnose MRSA infection nor to guide or monitor treatment for MRSA infections. Performed at [facility], [address] [place], [place] [address]   Urine Culture     Status: None   Collection Time: 08/13/20  5:33 PM   Specimen: Urine, Clean Catch  Result Value Ref Range Status   Specimen Description   Final    URINE, CLEAN CATCH Performed at [facility], [address], [place], [place] [address]    Special Requests   Final    NONE Performed at [facility], [address] [place], [place] [address]    Culture   Final    NO GROWTH Performed at [facility], [address] [place], [place] [address]    Report Status 08/15/2020 FINAL  Final         Radiology Studies: CT Angio Chest PE W/Cm &/Or Wo Cm  Result Date: 08/13/2020 CLINICAL DATA:  Chest pain, right flank pain, weakness EXAM: CT ANGIOGRAPHY CHEST CT ABDOMEN AND PELVIS WITH CONTRAST TECHNIQUE: Multidetector CT imaging of the chest was performed using the standard protocol during bolus administration of intravenous contrast. Multiplanar CT image reconstructions and MIPs were obtained to evaluate the vascular anatomy. Multidetector CT imaging of the abdomen and pelvis was performed using the standard protocol during bolus administration of intravenous contrast. CONTRAST:  OMNIPAQUE IOHEXOL 350 MG/ML SOLN COMPARISON:  CT abdomen pelvis 11/21/2003.  Same day chest x-ray. FINDINGS: CTA CHEST FINDINGS Cardiovascular: Satisfactory opacification of pulmonary arteries. No filling defect evident to the segmental branch level to suggest pulmonary embolism. Main pulmonary trunk is normal in caliber. Thoracic aorta is nonaneurysmal. Atherosclerotic calcifications of the aorta and coronary arteries. Heart size within normal limits. No pericardial effusion. Mediastinum/Nodes: No axillary, mediastinal, or hilar lymphadenopathy. Thyroid, trachea, and esophagus within normal limits. Lungs/Pleura: Small to moderate sized loculated pleural effusion along the posterior and lateral aspects of the right hemithorax. Fluid also tracks into the  minor fissure. There is associated compressive atelectasis within the right lower lobe. Left lung is clear. No left-sided pleural effusion. No pneumothorax. Musculoskeletal: Chronic displaced posterior left twelfth rib fracture. No acute osseous findings. No chest wall abnormality. Review of the MIP images confirms the above findings. CT ABDOMEN and PELVIS FINDINGS Hepatobiliary: No focal liver abnormality is seen. Status post cholecystectomy. No biliary dilatation.  Pancreas: Unremarkable. No pancreatic ductal dilatation or surrounding inflammatory changes. Spleen: Normal in size without focal abnormality. Adrenals/Urinary Tract: Unremarkable adrenal glands. Two right renal cysts are noted. Kidneys have otherwise symmetric enhancement. There is excreted contrast within the bilateral renal collecting systems. No definite renal stone. No hydronephrosis. Bilateral ureters are unremarkable. Urinary bladder within normal limits. Stomach/Bowel: Stomach is within normal limits. Scattered colonic diverticulosis. No evidence of bowel wall thickening, distention, or inflammatory changes. Vascular/Lymphatic: Atherosclerotic calcification throughout the aortoiliac axis. No aneurysm. No abdominopelvic lymphadenopathy. Reproductive: Status post hysterectomy. No adnexal masses. Other: No free fluid. No abdominopelvic fluid collection. No pneumoperitoneum. Musculoskeletal: Prior lumbar fusion. Mild perihardware lucency associated with the right transpedicular screw at L1 (series 8, image 78). 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 Severe arthropathy of the right hip. No acute osseous findings. Review of the MIP images confirms the above findings. IMPRESSION: 1. No evidence of pulmonary embolism. 2. Small-to-moderate loculated right-sided pleural effusion with associated compressive atelectasis. 3. No acute findings within the abdomen or pelvis. 4. Prior lumbar fusion with mild perihardware lucency associated with the right  transpedicular screw at L1, suggestive of loosening. 5. Colonic diverticulosis without evidence of diverticulitis. 6. Aortic and coronary artery atherosclerosis (ICD10-I70.0). Electronically Signed   By: Duanne Guess D.O.   On: 08/13/2020 14:22   [place] CHEST (PLEURAL EFFUSION)  Result Date: 08/14/2020 CLINICAL DATA:  87 year old female with history of pleural effusion. EXAM: CHEST ULTRASOUND COMPARISON:  No priors. FINDINGS: Small volume of loculated pleural fluid. IMPRESSION: 1. Small partially loculated right-sided pleural effusion suspicious for potential empyema. Electronically Signed   By: Trudie Reed M.D.   On: 08/14/2020 12:16   CT Abdomen Pelvis W Contrast  Result Date: 08/13/2020 CLINICAL DATA:  Chest pain, right flank pain, weakness EXAM: CT ANGIOGRAPHY CHEST CT ABDOMEN AND PELVIS WITH CONTRAST TECHNIQUE: Multidetector CT imaging of the chest was performed using the standard protocol during bolus administration of intravenous contrast. Multiplanar CT image reconstructions and MIPs were obtained to evaluate the vascular anatomy. Multidetector CT imaging of the abdomen and pelvis was performed using the standard protocol during bolus administration of intravenous contrast. CONTRAST:  OMNIPAQUE IOHEXOL 350 MG/ML SOLN COMPARISON:  CT abdomen pelvis 11/21/2003.  Same day chest x-ray. FINDINGS: CTA CHEST FINDINGS Cardiovascular: Satisfactory opacification of pulmonary arteries. No filling defect evident to the segmental branch level to suggest pulmonary embolism. Main pulmonary trunk is normal in caliber. Thoracic aorta is nonaneurysmal. Atherosclerotic calcifications of the aorta and coronary arteries. Heart size within normal limits. No pericardial effusion. Mediastinum/Nodes: No axillary, mediastinal, or hilar lymphadenopathy. Thyroid, trachea, and esophagus within normal limits. Lungs/Pleura: Small to moderate sized loculated pleural effusion along the posterior and lateral aspects of the  right hemithorax. Fluid also tracks into the minor fissure. There is associated compressive atelectasis within the right lower lobe. Left lung is clear. No left-sided pleural effusion. No pneumothorax. Musculoskeletal: Chronic displaced posterior left twelfth rib fracture. No acute osseous findings. No chest wall abnormality. Review of the MIP images confirms the above findings. CT ABDOMEN and PELVIS FINDINGS Hepatobiliary: No focal liver abnormality is seen. Status post cholecystectomy. No biliary dilatation. Pancreas: Unremarkable. No pancreatic ductal dilatation or surrounding inflammatory changes. Spleen: Normal in size without focal abnormality. Adrenals/Urinary Tract: Unremarkable adrenal glands. Two right renal cysts are noted. Kidneys have otherwise symmetric enhancement. There is excreted contrast within the bilateral renal collecting systems. No definite renal stone. No hydronephrosis. Bilateral ureters are unremarkable. Urinary bladder within normal limits. Stomach/Bowel: Stomach is within  normal limits. Scattered colonic diverticulosis. No evidence of bowel wall thickening, distention, or inflammatory changes. Vascular/Lymphatic: Atherosclerotic calcification throughout the aortoiliac axis. No aneurysm. No abdominopelvic lymphadenopathy. Reproductive: Status post hysterectomy. No adnexal masses. Other: No free fluid. No abdominopelvic fluid collection. No pneumoperitoneum. Musculoskeletal: Prior lumbar fusion. Mild perihardware lucency associated with the right transpedicular screw at L1 (series 8, image 78). 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 Severe arthropathy of the right hip. No acute osseous findings. Review of the MIP images confirms the above findings. IMPRESSION: 1. No evidence of pulmonary embolism. 2. Small-to-moderate loculated right-sided pleural effusion with associated compressive atelectasis. 3. No acute findings within the abdomen or pelvis. 4. Prior lumbar fusion with mild  perihardware lucency associated with the right transpedicular screw at L1, suggestive of loosening. 5. Colonic diverticulosis without evidence of diverticulitis. 6. Aortic and coronary artery atherosclerosis (ICD10-I70.0). Electronically Signed   By: Duanne Guess D.O.   On: 08/13/2020 14:22        Scheduled Meds: . aspirin  325 mg Oral Daily  . enoxaparin (LOVENOX) injection  40 mg Subcutaneous Q24H  . fluticasone  1 spray Each Nare Daily  . insulin aspart  0-5 Units Subcutaneous QHS  . insulin aspart  0-9 Units Subcutaneous TID WC  . insulin aspart  2 Units Subcutaneous TID WC  . insulin glargine  10 Units Subcutaneous Daily  . pantoprazole  40 mg Oral Daily   Continuous Infusions: . sodium chloride 75 mL/hr at 08/15/20 0702  . ceFEPime (MAXIPIME) IV Stopped (08/15/20 0300)  . vancomycin 1,000 mg (08/15/20 1132)     LOS: 2 days    Time spent: 35 minutes    Judy Mareno D Sherryll Burger, DO [facility]  If 7PM-7AM, please contact night-coverage [email or web address] 08/15/2020, 12:22 PM

## 2020-08-15 NOTE — Consult Note (Signed)
Consultation Note Date: 08/15/2020   Patient Name: [name]  DOB: 1926/03/30  MRN: [id number]  Age / Sex: 87 y.o., female  PCP: Elfredia NevinsFusco, Lawrence, MD Referring Physician: Erick BlinksShah, Pratik D, DO  Reason for Consultation: Establishing goals of care and Psychosocial/spiritual support  HPI/Patient Profile: 87 y.o. female  with past medical history of asthma, HTN/HLD, prior TIA, DM2, mitral regurgitation, GERD, anxiety, osteoarthritis admitted on 08/13/2020 with acute hypoxic respiratory failure secondary to possible right-sided empyema.   Clinical Assessment and Goals of Care: I have reviewed medical records including EPIC notes, labs and imaging, [facility], examined the patient.  [name] is lying quietly in bed, resting comfortably.  She wakes when I call her name.  She will make and somewhat keep eye contact, appearing acutely/chronically ill and frail.  She is oriented to person and place, I do not ask for the date.  I am unsure if she can make her basic needs known.  There is no family at bedside at this time.  Call to son, [name] to discuss diagnosis prognosis, GOC, EOL wishes, disposition and options.  I introduced Palliative Medicine as specialized medical care for people living with serious illness. It focuses on providing relief from the symptoms and stress of a serious illness.  As far as functional and nutritional status, [name] has been living at home alone, falling more over the last several days despite using her walker.  Her son lives behind her home and checks on her regularly.  We discussed her current illness and what it means in the larger context of her on-going co-morbidities.  We talked about lung empyema, and this is not treatable without surgery which patient and family are declining.  Natural disease trajectory and expectations at EOL were  discussed.  I attempted to elicit values and goals of care important to the patient.  [name] shares that his mother wants to return to her own home.  The difference between aggressive medical intervention and comfort care was considered in light of the patient's goals of care.  Hospice and Palliative Care services outpatient were explained and offered.  Judy ServerErnest and [name] are [facility]  They share that they would need a hospital bed and ambulance transport home.  [name] asks appropriate questions about symptom management in the future, help with at home care.  Advanced directives, concepts specific to code status, and rehospitalization were considered and discussed.  Prognosis discussed with permission.  I share that [name] is expected to become ill again, whether this is 1 week or 3 weeks.  I encourage [name] to consider whether he would hospitalize his mother, considering that she might pass away here in the hospital OR what he transition her to residential hospice for more symptom management.  Questions and concerns were addressed.  The family was encouraged to call with questions or concerns.   Conference with attending, bedside nursing staff, transition of care team related  to patient condition, needs, goals of care, disposition.  HCPOA   NEXT OF KIN - son Jamari Diana     SUMMARY OF RECOMMENDATIONS   DNR status Home with hospice of [facility] Anticipate do not rehospitalize Instead, transition to residential hospice when appropriate  Code Status/Advance Care Planning:  DNR  Symptom Management:   Per hospitalist, no additional needs at this time.  Per hospice protocol upon discharge  Palliative Prophylaxis:   Frequent Pain Assessment, Oral Care and Turn Reposition  Additional Recommendations (Limitations, Scope, Preferences):  At home hospice care  Psycho-social/Spiritual:   Desire for further  Chaplaincy support:no  Additional Recommendations: Caregiving  Support/Resources and Education on Hospice  Prognosis:   < 4 weeks would not be surprising based on acute illness, sharp decline in functional status over the past 2 weeks, family's desire to focus on hospice care  Discharge Planning: Home with hosp[facility] of [facility]      Primary Diagnoses: Present on Admission: . Acute hypoxemic respiratory failure (HCC)   I have reviewed the medical record, interviewed the patient and family, and examined the patient. The following aspects are pertinent.  Past Medical History:  Diagnosis Date  . Asthma   . Chronic headaches   . Coronary artery disease   . Hyperlipidemia   . Hypertension   . Mitral regurgitation   . Stroke [facility])    Social History   Socioeconomic History  . Marital status: Widowed    Spouse name: Not on file  . Number of children: Not on file  . Years of education: Not on file  . Highest education level: Not on file  Occupational History  . Occupation: Retired  Tobacco Use  . Smoking status: Former Games developer  . Smokeless tobacco: Never Used  . Tobacco comment: pt states she only smoked 2 cigs a day x 7months.   Substance and Sexual Activity  . Alcohol use: No  . Drug use: No  . Sexual activity: Not on file  Other Topics Concern  . Not on file  Social History Narrative   Exercises regularly   Social Determinants of Health   Financial Resource Strain: Not on file  Food Insecurity: Not on file  Transportation Needs: Not on file  Physical Activity: Not on file  Stress: Not on file  Social Connections: Not on file   Family History  Problem Relation Age of Onset  . Coronary artery disease Mother   . Diabetes Father   . COPD Father    Scheduled Meds: . aspirin  325 mg Oral Daily  . enoxaparin (LOVENOX) injection  40 mg Subcutaneous Q24H  . fluticasone  1 spray Each Nare Daily  . insulin aspart  0-5 Units Subcutaneous QHS  . insulin  aspart  0-9 Units Subcutaneous TID WC  . insulin aspart  2 Units Subcutaneous TID WC  . insulin glargine  10 Units Subcutaneous Daily  . pantoprazole  40 mg Oral Daily   Continuous Infusions: . sodium chloride 75 mL/hr at 08/15/20 0702  . ceFEPime (MAXIPIME) IV Stopped (08/15/20 0300)  . vancomycin 1,000 mg (08/15/20 1132)   PRN Meds:.acetaminophen **OR** acetaminophen, alprazolam, labetalol, levalbuterol, ondansetron **OR** ondansetron (ZOFRAN) IV Medications Prior to Admission:  Prior to Admission medications   Medication Sig Start Date End Date Taking? Authorizing Provider  alprazolam Prudy Feeler) 2 MG tablet Take 2 mg by mouth 3 (three) times daily as needed for sleep.   Yes [provider]  fluticasone (FLONASE) 50 MCG/ACT nasal spray Place 1  spray into the nose daily.   Yes [provider]  insulin NPH-regular Human (70-30) 100 UNIT/ML injection Inject 25 Units into the skin 2 (two) times daily before a meal.   Yes [provider]  losartan (COZAAR) 100 MG tablet Take 100 mg by mouth daily. 04/05/20  Yes [provider]  memantine (NAMENDA) 5 MG tablet Take 5 mg by mouth daily. 08/02/20  Yes [provider]  metoprolol succinate (TOPROL-XL) 25 MG 24 hr tablet Take 25 mg by mouth daily. 04/05/20  Yes [provider]  nystatin (MYCOSTATIN/NYSTOP) powder Apply 1 application topically 3 (three) times daily. 08/02/20  Yes [provider]  torsemide (DEMADEX) 20 MG tablet Take 20 mg by mouth daily. 07/10/20  Yes [provider]  acetaminophen (TYLENOL) 650 MG CR tablet Take 650 mg by mouth every 8 (eight) hours as needed for pain or fever.    [provider]  albuterol (PROVENTIL) (2.5 MG/3ML) 0.083% nebulizer solution Take 2.5 mg by nebulization 4 (four) times daily. Patient not taking: Reported on 08/15/2020    [provider]  aspirin 325 MG tablet Take 1 tablet (325 mg total) by mouth daily. 05/30/20   Vassie Loll, MD  COD LIVER OIL W/VIT A & D PO Take 1 tablet by mouth daily.    [provider]  fluconazole (DIFLUCAN) 100 MG tablet Take 100 mg by mouth daily. Patient not taking: No sig reported 08/02/20   [provider]  ipratropium (ATROVENT) 0.02 % nebulizer solution Take 500 mcg by nebulization 4 (four) times daily. Patient not taking: Reported on 08/15/2020    [provider]  Multiple Vitamins-Minerals (PROTEGRA PO) Take 1 tablet by mouth daily.    [provider]  pantoprazole (PROTONIX) 40 MG tablet Take 40 mg by mouth daily. Patient not taking: Reported on 08/15/2020    [provider]   Allergies  Allergen Reactions  . Gabapentin     Other reaction(s): Unknown  . Morphine And Related     Family stated, "Morphine makes her wild and crazy"  . Percocet [Oxycodone-Acetaminophen]   . Povidone-Iodine     Other reaction(s): Unknown  . Prednisone    Review of Systems  Unable to perform ROS: Acuity of condition    Physical Exam Vitals and nursing note reviewed.  Constitutional:      General: She is not in acute distress.    Appearance: She is ill-appearing.  HENT:     Mouth/Throat:     Mouth: Mucous membranes are dry.  Cardiovascular:     Rate and Rhythm: Normal rate.  Pulmonary:     Effort: Pulmonary effort is normal. No respiratory distress.  Skin:    General: Skin is warm and dry.  Neurological:     Comments: Oriented to person and place, date not questioned  Psychiatric:     Comments: Calm     Vital Signs: BP 111/66 (BP Location: Left Arm)   Pulse 100   Temp 98.1 F (36.7 C)   Resp 20   Ht 5' (1.524 m)   Wt 69.9 kg   SpO2 94%   BMI 30.08 kg/m  Pain Scale: Faces   Pain Score: Asleep   SpO2: SpO2: 94 % O2 Device:SpO2: 94 % O2 Flow Rate: .O2 Flow Rate (L/min): 2 L/min  IO: Intake/output summary:   Intake/Output Summary (Last 24 hours) at 08/15/2020 1205 Last data filed at 08/15/2020 0900 Gross per 24 hour   Intake 3180.92 ml  Output  900 ml  Net 2280.92 ml    LBM: Last BM Date: 08/10/20 Baseline Weight: Weight: 69.9 kg Most recent weight: Weight: 69.9 kg     Palliative Assessment/Data:   Flowsheet Rows   Flowsheet Row Most Recent Value  Intake Tab   Referral Department Hospitalist  Unit at Time of Referral Cardiac/Telemetry Unit  Palliative Care Primary Diagnosis Sepsis/Infectious Disease  Date Notified 08/15/20  Palliative Care Type New Palliative care  Reason for referral Clarify Goals of Care  Date of Admission 08/13/20  Date first seen by Palliative Care 08/15/20  # of days Palliative referral response time 0 Day(s)  # of days IP prior to Palliative referral 2  Clinical Assessment   Palliative Performance Scale Score 20%  Pain Max last 24 hours Not able to report  Pain Min Last 24 hours Not able to report  Dyspnea Max Last 24 Hours Not able to report  Dyspnea Min Last 24 hours Not able to report  Psychosocial & Spiritual Assessment   Palliative Care Outcomes       Time In: 1240 Time Out: 1350 Time Total: 70 minutes Greater than 50%  of this time was spent counseling and coordinating care related to the above assessment and plan.  Signed by: Katheran Awe, NP   Please contact Palliative Medicine Team phone at [phone] for questions and concerns.  For individual provider: See Loretha Stapler

## 2020-08-15 NOTE — TOC Progression Note (Signed)
Transition of Care [facility]) - Progression Note    Patient Details  Name: [name] MRN: [id number] Date of Birth: 07-Jul-1925  Transition of Care [facility]) CM/SW Contact  Elliot Gault, LCSW Phone Number: 08/15/2020, 2:54 PM  Clinical Narrative:     TOC following. Pt and family now select home with hospice for dc plan. Referral made to [facility] at family request. Anticipating dc tomorrow once DME delivered.   Will follow up in AM.  Expected Discharge Plan: Home w Hospice Care Barriers to Discharge: Insurance Authorization,Continued Medical Work up  Expected Discharge Plan and Services Expected Discharge Plan: Home w Hospice Care In-house Referral: Clinical Social Work   Post Acute Care Choice: Skilled Nursing Facility Living arrangements for the past 2 months: Single Family Home                                       Social Determinants of Health (SDOH) Interventions    Readmission Risk Interventions Readmission Risk Prevention Plan 08/14/2020  Medication Screening Complete  Transportation Screening Complete  Some recent data might be hidden

## 2020-08-16 DIAGNOSIS — J90 Pleural effusion, not elsewhere classified: Secondary | ICD-10-CM

## 2020-08-16 DIAGNOSIS — Z515 Encounter for palliative care: Secondary | ICD-10-CM

## 2020-08-16 LAB — GLUCOSE, CAPILLARY
Glucose-Capillary: 178 mg/dL — ABNORMAL HIGH (ref 70–99)
Glucose-Capillary: 226 mg/dL — ABNORMAL HIGH (ref 70–99)

## 2020-08-16 MED ORDER — DM-GUAIFENESIN ER 30-600 MG PO TB12: 1.0000 | ORAL_TABLET | Freq: Two times a day (BID) | ORAL | Status: DC

## 2020-08-16 MED ORDER — DM-GUAIFENESIN ER 30-600 MG PO TB12: 1.0000 | ORAL_TABLET | Freq: Two times a day (BID) | ORAL | 1 refills | Status: AC

## 2020-08-16 MED ORDER — METOPROLOL SUCCINATE ER 25 MG PO TB24
25.0000 mg | ORAL_TABLET | Freq: Every day | ORAL | Status: DC
  Administered 2020-08-16: 25 mg via ORAL
  Filled 2020-08-16: qty 1

## 2020-08-16 MED ORDER — MORPHINE SULFATE (CONCENTRATE) 10 MG /0.5 ML PO SOLN: 10.0000 mg | ORAL | 0 refills | Status: AC | PRN

## 2020-08-16 MED ORDER — ALBUTEROL SULFATE (2.5 MG/3ML) 0.083% IN NEBU: 2.5000 mg | INHALATION_SOLUTION | Freq: Four times a day (QID) | RESPIRATORY_TRACT | 12 refills | Status: AC | PRN

## 2020-08-16 NOTE — Discharge Summary (Signed)
Physician Discharge Summary  [name] VWU:[id number] DOB: 1926-02-22 DOA: 08/13/2020  PCP: Elfredia Nevins, MD  Admit date: 08/13/2020 Discharge date: 08/16/2020  Admitted From: home Disposition:  Home with hospice  Recommendations for Outpatient Follow-up:  1. Patient is being discharged home with hospice services  Discharge Condition: terminal CODE STATUS: DNR, comfort measures Diet recommendation: regular diet for comfort  Brief/Interim Summary: Judy Penninger Vernonis a 87 y.o.femalewith medical history significant forasthma, hyperlipidemia, hypertension,prior TIA, DM2,mitral regurgitation, GERD,anxiety,and osteoarthritiswho presented to the ED with complaints of some weakness as well as right flank pain and chest painthat began several days ago.Patient was admitted with sepsis secondary to suspected right-sided empyema and was started on cefepime and vancomycin empirically. She was also noted to have acute metabolic encephalopathy secondary to this.  She has had minimal fluid drained with thoracentesis on 4/25 and likely has ongoing loculated effusions that would need to be addressed.  Upon further discussion with the son at bedside, he states that she would not want any further aggressive care to include transfer for evaluation by CT surgery and states that home hospice is what she would want.  Palliative care consulted with plans for discharge to home with hospice once equipment delivered.  Discharge Diagnoses:  Active Problems:   Acute hypoxemic respiratory failure (HCC)   Hospice care patient  Sepsis likely secondary to possible right-sided empyema -Loculated fluid noted with compressive atelectasis to right lung field -Received fluid bolus in the ED, lactic acid 1.7 -Ultrasound thoracentesis with minimal fluid obtained -She was treated with cefepime and vancomycin -MRSA PCRnegative -COVID swabnegative -Urine analysiswith pyuria, but no frank  UTI -Continues to have leukocytosis and option to repeat CT chest and likely transfer for CT surgery evaluation discussed with son at bedside, he declines and states that she would like to go home with hospice.   -Patient seen by palliative care and after discussing with son, plan to discharge home with hospice  Acute metabolic encephalopathy secondary to above -In the setting of what appears to be mild cognitive impairment versus dementia -Continue to monitor closely -Urine analysiswith pyuria, UCx no growth  Acute hypoxemic respiratory failure secondary to above -Wean as tolerated based on treatment as above -Initially on room air at home -Incentive spirometry  History of hypertension -Blood pressure currently stable  Type 2 diabetes-elevated BG readings -Hemoglobin A1c 6.0% on 05/2020 -Carb modified diet -Treated with Lantus and sliding scale insulin  Dyslipidemia -Appears to be diet controlled  Frequent falls/debility -PT/OT evaluation withnoted need for SNF, but now going home with hospice -Fall precautions   History of anxiety -As needed anxiolytics  History of asthma/allergic rhinitis -Bronchodilators as needed for wheezing or shortness of breath   Discharge Instructions  Discharge Instructions    Diet - low sodium heart healthy   Complete by: As directed    Increase activity slowly   Complete by: As directed      Allergies as of 08/16/2020      Reactions   Gabapentin    Other reaction(s): Unknown   Morphine And Related    Family stated, "Morphine makes her wild and crazy"   Percocet [oxycodone-acetaminophen]    Povidone-iodine    Other reaction(s): Unknown   Prednisone       Medication List    STOP taking these medications   aspirin 325 MG tablet   COD LIVER OIL W/VIT A & D PO   fluconazole 100 MG tablet Commonly known as: DIFLUCAN   fluticasone 50 MCG/ACT nasal  spray Commonly known as: FLONASE   insulin NPH-regular Human (70-30)  100 UNIT/ML injection   ipratropium 0.02 % nebulizer solution Commonly known as: ATROVENT   losartan 100 MG tablet Commonly known as: COZAAR   memantine 5 MG tablet Commonly known as: NAMENDA   metoprolol succinate 25 MG 24 hr tablet Commonly known as: TOPROL-XL   nystatin powder Commonly known as: MYCOSTATIN/NYSTOP   pantoprazole 40 MG tablet Commonly known as: PROTONIX   PROTEGRA PO   torsemide 20 MG tablet Commonly known as: DEMADEX     TAKE these medications   acetaminophen 650 MG CR tablet Commonly known as: TYLENOL Take 650 mg by mouth every 8 (eight) hours as needed for pain or fever.   albuterol (2.5 MG/3ML) 0.083% nebulizer solution Commonly known as: PROVENTIL Take 3 mLs (2.5 mg total) by nebulization every 6 (six) hours as needed for wheezing or shortness of breath. What changed:   when to take this  reasons to take this   alprazolam 2 MG tablet Commonly known as: XANAX Take 2 mg by mouth 3 (three) times daily as needed for sleep.   dextromethorphan-guaiFENesin 30-600 MG 12hr tablet Commonly known as: MUCINEX DM Take 1 tablet by mouth 2 (two) times daily.   morphine CONCENTRATE 10 mg / 0.5 ml concentrated solution Place 0.5 mLs (10 mg total) under the tongue every 2 (two) hours as needed for severe pain or shortness of breath.       Allergies  Allergen Reactions  . Gabapentin     Other reaction(s): Unknown  . Morphine And Related     Family stated, "Morphine makes her wild and crazy"  . Percocet [Oxycodone-Acetaminophen]   . Povidone-Iodine     Other reaction(s): Unknown  . Prednisone     Consultations:  Palliative care   Procedures/Studies: CT Angio Chest PE W/Cm &/Or Wo Cm  Result Date: 08/13/2020 CLINICAL DATA:  Chest pain, right flank pain, weakness EXAM: CT ANGIOGRAPHY CHEST CT ABDOMEN AND PELVIS WITH CONTRAST TECHNIQUE: Multidetector CT imaging of the chest was performed using the standard protocol during bolus administration  of intravenous contrast. Multiplanar CT image reconstructions and MIPs were obtained to evaluate the vascular anatomy. Multidetector CT imaging of the abdomen and pelvis was performed using the standard protocol during bolus administration of intravenous contrast. CONTRAST:  OMNIPAQUE IOHEXOL 350 MG/ML SOLN COMPARISON:  CT abdomen pelvis 11/21/2003.  Same day chest x-ray. FINDINGS: CTA CHEST FINDINGS Cardiovascular: Satisfactory opacification of pulmonary arteries. No filling defect evident to the segmental branch level to suggest pulmonary embolism. Main pulmonary trunk is normal in caliber. Thoracic aorta is nonaneurysmal. Atherosclerotic calcifications of the aorta and coronary arteries. Heart size within normal limits. No pericardial effusion. Mediastinum/Nodes: No axillary, mediastinal, or hilar lymphadenopathy. Thyroid, trachea, and esophagus within normal limits. Lungs/Pleura: Small to moderate sized loculated pleural effusion along the posterior and lateral aspects of the right hemithorax. Fluid also tracks into the minor fissure. There is associated compressive atelectasis within the right lower lobe. Left lung is clear. No left-sided pleural effusion. No pneumothorax. Musculoskeletal: Chronic displaced posterior left twelfth rib fracture. No acute osseous findings. No chest wall abnormality. Review of the MIP images confirms the above findings. CT ABDOMEN and PELVIS FINDINGS Hepatobiliary: No focal liver abnormality is seen. Status post cholecystectomy. No biliary dilatation. Pancreas: Unremarkable. No pancreatic ductal dilatation or surrounding inflammatory changes. Spleen: Normal in size without focal abnormality. Adrenals/Urinary Tract: Unremarkable adrenal glands. Two right renal cysts are noted. Kidneys have otherwise symmetric enhancement.  There is excreted contrast within the bilateral renal collecting systems. No definite renal stone. No hydronephrosis. Bilateral ureters are unremarkable.  Urinary bladder within normal limits. Stomach/Bowel: Stomach is within normal limits. Scattered colonic diverticulosis. No evidence of bowel wall thickening, distention, or inflammatory changes. Vascular/Lymphatic: Atherosclerotic calcification throughout the aortoiliac axis. No aneurysm. No abdominopelvic lymphadenopathy. Reproductive: Status post hysterectomy. No adnexal masses. Other: No free fluid. No abdominopelvic fluid collection. No pneumoperitoneum. Musculoskeletal: Prior lumbar fusion. Mild perihardware lucency associated with the right transpedicular screw at L1 (series 8, image 78). 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 Severe arthropathy of the right hip. No acute osseous findings. Review of the MIP images confirms the above findings. IMPRESSION: 1. No evidence of pulmonary embolism. 2. Small-to-moderate loculated right-sided pleural effusion with associated compressive atelectasis. 3. No acute findings within the abdomen or pelvis. 4. Prior lumbar fusion with mild perihardware lucency associated with the right transpedicular screw at L1, suggestive of loosening. 5. Colonic diverticulosis without evidence of diverticulitis. 6. Aortic and coronary artery atherosclerosis (ICD10-I70.0). Electronically Signed   By: Duanne Guess D.O.   On: 08/13/2020 14:22   [place] CHEST (PLEURAL EFFUSION)  Result Date: 08/14/2020 CLINICAL DATA:  87 year old female with history of pleural effusion. EXAM: CHEST ULTRASOUND COMPARISON:  No priors. FINDINGS: Small volume of loculated pleural fluid. IMPRESSION: 1. Small partially loculated right-sided pleural effusion suspicious for potential empyema. Electronically Signed   By: Trudie Reed M.D.   On: 08/14/2020 12:16   CT Abdomen Pelvis W Contrast  Result Date: 08/13/2020 CLINICAL DATA:  Chest pain, right flank pain, weakness EXAM: CT ANGIOGRAPHY CHEST CT ABDOMEN AND PELVIS WITH CONTRAST TECHNIQUE: Multidetector CT imaging of the chest was performed using the  standard protocol during bolus administration of intravenous contrast. Multiplanar CT image reconstructions and MIPs were obtained to evaluate the vascular anatomy. Multidetector CT imaging of the abdomen and pelvis was performed using the standard protocol during bolus administration of intravenous contrast. CONTRAST:  OMNIPAQUE IOHEXOL 350 MG/ML SOLN COMPARISON:  CT abdomen pelvis 11/21/2003.  Same day chest x-ray. FINDINGS: CTA CHEST FINDINGS Cardiovascular: Satisfactory opacification of pulmonary arteries. No filling defect evident to the segmental branch level to suggest pulmonary embolism. Main pulmonary trunk is normal in caliber. Thoracic aorta is nonaneurysmal. Atherosclerotic calcifications of the aorta and coronary arteries. Heart size within normal limits. No pericardial effusion. Mediastinum/Nodes: No axillary, mediastinal, or hilar lymphadenopathy. Thyroid, trachea, and esophagus within normal limits. Lungs/Pleura: Small to moderate sized loculated pleural effusion along the posterior and lateral aspects of the right hemithorax. Fluid also tracks into the minor fissure. There is associated compressive atelectasis within the right lower lobe. Left lung is clear. No left-sided pleural effusion. No pneumothorax. Musculoskeletal: Chronic displaced posterior left twelfth rib fracture. No acute osseous findings. No chest wall abnormality. Review of the MIP images confirms the above findings. CT ABDOMEN and PELVIS FINDINGS Hepatobiliary: No focal liver abnormality is seen. Status post cholecystectomy. No biliary dilatation. Pancreas: Unremarkable. No pancreatic ductal dilatation or surrounding inflammatory changes. Spleen: Normal in size without focal abnormality. Adrenals/Urinary Tract: Unremarkable adrenal glands. Two right renal cysts are noted. Kidneys have otherwise symmetric enhancement. There is excreted contrast within the bilateral renal collecting systems. No definite renal stone. No  hydronephrosis. Bilateral ureters are unremarkable. Urinary bladder within normal limits. Stomach/Bowel: Stomach is within normal limits. Scattered colonic diverticulosis. No evidence of bowel wall thickening, distention, or inflammatory changes. Vascular/Lymphatic: Atherosclerotic calcification throughout the aortoiliac axis. No aneurysm. No abdominopelvic lymphadenopathy. Reproductive: Status post hysterectomy. No  adnexal masses. Other: No free fluid. No abdominopelvic fluid collection. No pneumoperitoneum. Musculoskeletal: Prior lumbar fusion. Mild perihardware lucency associated with the right transpedicular screw at L1 (series 8, image 78). 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 Severe arthropathy of the right hip. No acute osseous findings. Review of the MIP images confirms the above findings. IMPRESSION: 1. No evidence of pulmonary embolism. 2. Small-to-moderate loculated right-sided pleural effusion with associated compressive atelectasis. 3. No acute findings within the abdomen or pelvis. 4. Prior lumbar fusion with mild perihardware lucency associated with the right transpedicular screw at L1, suggestive of loosening. 5. Colonic diverticulosis without evidence of diverticulitis. 6. Aortic and coronary artery atherosclerosis (ICD10-I70.0). Electronically Signed   By: Duanne GuessNicholas  Plundo D.O.   On: 08/13/2020 14:22   DG Chest Port 1 View  Result Date: 08/13/2020 CLINICAL DATA:  Chest pain and weakness. EXAM: PORTABLE CHEST 1 VIEW COMPARISON:  05/28/2020 FINDINGS: Normal heart size. Aortic atherosclerosis. There is a new opacity overlying the right mid and lower lung concerning for loculated pleural effusion. Fluid tracking along the major fissure is noted within the perihilar right lung. Left lung appears clear. IMPRESSION: New opacity overlying the right mid and lower lung concerning for loculated pleural effusion. Electronically Signed   By: Signa Kellaylor  Stroud M.D.   On: 08/13/2020 11:13        Subjective: She is coughing. Says she feels weak  Discharge Exam: Vitals:   08/15/20 2101 08/16/20 0045 08/16/20 0332 08/16/20 0355  BP: (!) 99/57 121/78 133/79 127/74  Pulse: 88 (!) 119 (!) 135 95  Resp: 19 (!) 30  18  Temp: 98.3 F (36.8 C) 97.9 F (36.6 C)  97.8 F (36.6 C)  TempSrc: Oral Oral    SpO2: 93% 95% 92% 93%  Weight:      Height:        General: Pt is alert, awake, not in acute distress Cardiovascular: RRR, S1/S2 +, no rubs, no gallops Respiratory: coarse breath sounds at bases Abdominal: Soft, NT, ND, bowel sounds + Extremities: no edema, no cyanosis    The results of significant diagnostics from this hospitalization (including imaging, microbiology, ancillary and laboratory) are listed below for reference.     Microbiology: Recent Results (from the past 240 hour(s))  Culture, blood (Routine X 2) w Reflex to ID Panel     Status: None (Preliminary result)   Collection Time: 08/13/20 12:54 PM   Specimen: BLOOD RIGHT WRIST  Result Value Ref Range Status   Specimen Description BLOOD RIGHT WRIST  Final   Special Requests   Final    Blood Culture results may not be optimal due to an inadequate volume of blood received in culture bottles BOTTLES DRAWN AEROBIC AND ANAEROBIC   Culture   Final    NO GROWTH 3 DAYS Performed at [facility], [address] [place], [place] [id number]    Report Status PENDING  Incomplete  Culture, blood (Routine X 2) w Reflex to ID Panel     Status: None (Preliminary result)   Collection Time: 08/13/20  1:23 PM   Specimen: BLOOD RIGHT ARM  Result Value Ref Range Status   Specimen Description BLOOD RIGHT ARM  Final   Special Requests   Final    Blood Culture results may not be optimal due to an inadequate volume of blood received in culture bottles BOTTLES DRAWN AEROBIC AND ANAEROBIC   Culture   Final    NO GROWTH 3 DAYS Performed at [facility], [address]  [place], [place] [address]    Report Status PENDING   Incomplete  Resp Panel by RT-PCR (Flu A&B, Covid) Nasopharyngeal Swab     Status: None   Collection Time: 08/13/20  2:05 PM   Specimen: Nasopharyngeal Swab; Nasopharyngeal(NP) swabs in vial transport medium  Result Value Ref Range Status   SARS Coronavirus 2 by RT PCR NEGATIVE NEGATIVE Final    Comment: (NOTE) SARS-CoV-2 target nucleic acids are NOT DETECTED.  The SARS-CoV-2 RNA is generally detectable in upper respiratory specimens during the acute phase of infection. The lowest concentration of SARS-CoV-2 viral copies this assay can detect is 138 copies/mL. A negative result does not preclude SARS-Cov-2 infection and should not be used as the sole basis for treatment or other patient management decisions. A negative result may occur with  improper specimen collection/handling, submission of specimen other than nasopharyngeal swab, presence of viral mutation(s) within the areas targeted by this assay, and inadequate number of viral copies(<138 copies/mL). A negative result must be combined with clinical observations, patient history, and epidemiological information. The expected result is Negative.  Fact Sheet for Patients:  BloggerCourse.com  Fact Sheet for Healthcare Providers:  SeriousBroker.it  This test is no t yet approved or cleared by the [place] FDA and  has been authorized for detection and/or diagnosis of SARS-CoV-2 by FDA under an Emergency Use Authorization (EUA). This EUA will remain  in effect (meaning this test can be used) for the duration of the COVID-19 declaration under Section 564(b)(1) of the Act, 21 U.S.C.section 360bbb-3(b)(1), unless the authorization is terminated  or revoked sooner.       Influenza A by PCR NEGATIVE NEGATIVE Final   Influenza B by PCR NEGATIVE NEGATIVE Final    Comment: (NOTE) The Xpert Xpress SARS-CoV-2/FLU/RSV plus assay is intended as an aid in the diagnosis of influenza  from Nasopharyngeal swab specimens and should not be used as a sole basis for treatment. Nasal washings and aspirates are unacceptable for Xpert Xpress SARS-CoV-2/FLU/RSV testing.  Fact Sheet for Patients: BloggerCourse.com  Fact Sheet for Healthcare Providers: SeriousBroker.it  This test is not yet approved or cleared by the [place] FDA and has been authorized for detection and/or diagnosis of SARS-CoV-2 by FDA under an Emergency Use Authorization (EUA). This EUA will remain in effect (meaning this test can be used) for the duration of the COVID-19 declaration under Section 564(b)(1) of the Act, 21 U.S.C. section 360bbb-3(b)(1), unless the authorization is terminated or revoked.  Performed at [facility], [address], [place], [place] [address]   MRSA PCR Screening     Status: None   Collection Time: 08/13/20  4:58 PM   Specimen: Nasal Mucosa; Nasopharyngeal  Result Value Ref Range Status   MRSA by PCR NEGATIVE NEGATIVE Final    Comment:        The GeneXpert MRSA Assay (FDA approved for NASAL specimens only), is one component of a comprehensive MRSA colonization surveillance program. It is not intended to diagnose MRSA infection nor to guide or monitor treatment for MRSA infections. Performed at [facility], [address], [place], [place] [address]   Urine Culture     Status: None   Collection Time: 08/13/20  5:33 PM   Specimen: Urine, Clean Catch  Result Value Ref Range Status   Specimen Description   Final    URINE, CLEAN CATCH Performed at [facility], [address], [place], [place] [address]    Special Requests   Final    NONE Performed  at [facility], [address], [place], [place] [address]    Culture   Final    NO GROWTH Performed at [facility], [address] [place], [place] [address]    Report Status 08/15/2020 FINAL  Final     Labs: BNP (last 3 results) No results for  input(s): BNP in the last 8760 hours. Basic Metabolic Panel: Recent Labs  Lab 08/13/20 1041 08/14/20 0520 08/15/20 0619  NA 138 137 136  K 3.6 3.5 3.6  CL 96* 100 103  CO2 29 27 25   GLUCOSE 254* 172* 255*  BUN 22 17 15   CREATININE 0.83 0.63 0.67  CALCIUM 8.8* 8.3* 7.9*  MG  --  1.9 2.0   Liver Function Tests: Recent Labs  Lab 08/13/20 1041  AST 60*  ALT 55*  ALKPHOS 128*  BILITOT 1.7*  PROT 7.0  ALBUMIN 2.9*   Recent Labs  Lab 08/13/20 1041  LIPASE 24   No results for input(s): AMMONIA in the last 168 hours. CBC: Recent Labs  Lab 08/13/20 1041 08/14/20 0520 08/15/20 0619  WBC 27.9* 28.0* 27.1*  NEUTROABS 24.8*  --   --   HGB 13.2 11.3* 11.4*  HCT 40.7 35.0* 35.9*  MCV 106.0* 105.7* 106.5*  PLT 321 325 318   Cardiac Enzymes: No results for input(s): CKTOTAL, CKMB, CKMBINDEX, TROPONINI in the last 168 hours. BNP: Invalid input(s): POCBNP CBG: Recent Labs  Lab 08/15/20 1116 08/15/20 1624 08/15/20 2058 08/16/20 0735 08/16/20 1126  GLUCAP 236* 229* 211* 178* 226*   D-Dimer No results for input(s): DDIMER in the last 72 hours. Hgb A1c No results for input(s): HGBA1C in the last 72 hours. Lipid Profile No results for input(s): CHOL, HDL, LDLCALC, TRIG, CHOLHDL, LDLDIRECT in the last 72 hours. Thyroid function studies No results for input(s): TSH, T4TOTAL, T3FREE, THYROIDAB in the last 72 hours.  Invalid input(s): FREET3 Anemia work up No results for input(s): VITAMINB12, FOLATE, FERRITIN, TIBC, IRON, RETICCTPCT in the last 72 hours. Urinalysis    Component Value Date/Time   COLORURINE YELLOW 08/13/2020 1733   APPEARANCEUR HAZY (A) 08/13/2020 1733   LABSPEC >1.046 (H) 08/13/2020 1733   PHURINE 6.0 08/13/2020 1733   GLUCOSEU 50 (A) 08/13/2020 1733   HGBUR NEGATIVE 08/13/2020 1733   BILIRUBINUR NEGATIVE 08/13/2020 1733   KETONESUR 5 (A) 08/13/2020 1733   PROTEINUR 30 (A) 08/13/2020 1733   NITRITE NEGATIVE 08/13/2020 1733   LEUKOCYTESUR LARGE  (A) 08/13/2020 1733   Sepsis Labs Invalid input(s): PROCALCITONIN,  WBC,  LACTICIDVEN Microbiology Recent Results (from the past 240 hour(s))  Culture, blood (Routine X 2) w Reflex to ID Panel     Status: None (Preliminary result)   Collection Time: 08/13/20 12:54 PM   Specimen: BLOOD RIGHT WRIST  Result Value Ref Range Status   Specimen Description BLOOD RIGHT WRIST  Final   Special Requests   Final    Blood Culture results may not be optimal due to an inadequate volume of blood received in culture bottles BOTTLES DRAWN AEROBIC AND ANAEROBIC   Culture   Final    NO GROWTH 3 DAYS Performed at [facility], [address], [place], [address] [place]    Report Status PENDING  Incomplete  Culture, blood (Routine X 2) w Reflex to ID Panel     Status: None (Preliminary result)   Collection Time: 08/13/20  1:23 PM   Specimen: BLOOD RIGHT ARM  Result Value Ref Range Status   Specimen Description BLOOD RIGHT  ARM  Final   Special Requests   Final    Blood Culture results may not be optimal due to an inadequate volume of blood received in culture bottles BOTTLES DRAWN AEROBIC AND ANAEROBIC   Culture   Final    NO GROWTH 3 DAYS Performed at [facility], [address], [place], [place] [address]    Report Status PENDING  Incomplete  Resp Panel by RT-PCR (Flu A&B, Covid) Nasopharyngeal Swab     Status: None   Collection Time: 08/13/20  2:05 PM   Specimen: Nasopharyngeal Swab; Nasopharyngeal(NP) swabs in vial transport medium  Result Value Ref Range Status   SARS Coronavirus 2 by RT PCR NEGATIVE NEGATIVE Final    Comment: (NOTE) SARS-CoV-2 target nucleic acids are NOT DETECTED.  The SARS-CoV-2 RNA is generally detectable in upper respiratory specimens during the acute phase of infection. The lowest concentration of SARS-CoV-2 viral copies this assay can detect is 138 copies/mL. A negative result does not preclude SARS-Cov-2 infection and should not be used as the sole basis for treatment  or other patient management decisions. A negative result may occur with  improper specimen collection/handling, submission of specimen other than nasopharyngeal swab, presence of viral mutation(s) within the areas targeted by this assay, and inadequate number of viral copies(<138 copies/mL). A negative result must be combined with clinical observations, patient history, and epidemiological information. The expected result is Negative.  Fact Sheet for Patients:  BloggerCourse.com  Fact Sheet for Healthcare Providers:  SeriousBroker.it  This test is no t yet approved or cleared by the [place] FDA and  has been authorized for detection and/or diagnosis of SARS-CoV-2 by FDA under an Emergency Use Authorization (EUA). This EUA will remain  in effect (meaning this test can be used) for the duration of the COVID-19 declaration under Section 564(b)(1) of the Act, 21 U.S.C.section 360bbb-3(b)(1), unless the authorization is terminated  or revoked sooner.       Influenza A by PCR NEGATIVE NEGATIVE Final   Influenza B by PCR NEGATIVE NEGATIVE Final    Comment: (NOTE) The Xpert Xpress SARS-CoV-2/FLU/RSV plus assay is intended as an aid in the diagnosis of influenza from Nasopharyngeal swab specimens and should not be used as a sole basis for treatment. Nasal washings and aspirates are unacceptable for Xpert Xpress SARS-CoV-2/FLU/RSV testing.  Fact Sheet for Patients: BloggerCourse.com  Fact Sheet for Healthcare Providers: SeriousBroker.it  This test is not yet approved or cleared by the [place] FDA and has been authorized for detection and/or diagnosis of SARS-CoV-2 by FDA under an Emergency Use Authorization (EUA). This EUA will remain in effect (meaning this test can be used) for the duration of the COVID-19 declaration under Section 564(b)(1) of the Act, 21 U.S.C. section  360bbb-3(b)(1), unless the authorization is terminated or revoked.  Performed at [facility], [address], [place], [place] [address]   MRSA PCR Screening     Status: None   Collection Time: 08/13/20  4:58 PM   Specimen: Nasal Mucosa; Nasopharyngeal  Result Value Ref Range Status   MRSA by PCR NEGATIVE NEGATIVE Final    Comment:        The GeneXpert MRSA Assay (FDA approved for NASAL specimens only), is one component of a comprehensive MRSA colonization surveillance program. It is not intended to diagnose MRSA infection nor to guide or monitor treatment for MRSA infections. Performed at [facility], [address], [place], [place] [address]   Urine Culture     Status: None  Collection Time: 08/13/20  5:33 PM   Specimen: Urine, Clean Catch  Result Value Ref Range Status   Specimen Description   Final    URINE, CLEAN CATCH Performed at [facility], [address], [place], [place] [address]    Special Requests   Final    NONE Performed at [facility], [address], [place], [place] [address]    Culture   Final    NO GROWTH Performed at [facility], [address] [place], [place] [address]    Report Status 08/15/2020 FINAL  Final     Time coordinating discharge:  SIGNED:   Erick Blinks, MD  [facility] 08/16/2020, 12:22 PM   If 7PM-7AM, please contact night-coverage [email or web address]

## 2020-08-16 NOTE — Progress Notes (Signed)
EKG done and given to nurse 

## 2020-08-16 NOTE — Progress Notes (Signed)
2:54 AM RN called due to patient being in paroxysmal A. fib with RVR with HR ranging from 132-165.  EKG was done and showed A. fib with RVR at rate of 149 bpm.  She was noted that she has not taken her home Toprol since admission, this was restarted.

## 2020-08-16 NOTE — Care Management Important Message (Signed)
Important Message  Patient Details  Name: ALEXANDRA LIPPS MRN: [id number] Date of Birth: May 15, 1925   Medicare Important Message Given:  Yes Late entry, delivered at 9:00am    Donyel Nester L Christell Constant 08/16/2020, 2:26 PM

## 2020-08-16 NOTE — TOC Transition Note (Signed)
Transition of Care [facility]) - CM/SW Discharge Note   Patient Details  Name: [name] MRN: [id number] Date of Birth: 05-15-1925  [facility] CM/SW Contact:  Elliot Gault, LCSW Phone Number: 08/16/2020, 1:24 PM   Clinical Narrative:     Pt stable to dc home with hospice per MD. Confirmed with Hospice that DME is at pt's home and pt's son has spoken with MD to confirm dc plan. EMS will transport.  No other TOC needs for dc.  Final next level of care: Home w Hospice Care Barriers to Discharge: Barriers Resolved   Patient Goals and CMS Choice Patient states their goals for this hospitalization and ongoing recovery are:: get better CMS Medicare.gov Compare Post Acute Care list provided to:: Patient Represenative (must comment) Choice offered to / list presented to : Adult Children  Discharge Placement                       Discharge Plan and Services In-house Referral: Clinical Social Work   Post Acute Care Choice: Skilled Nursing Facility                               Social Determinants of Health (SDOH) Interventions     Readmission Risk Interventions Readmission Risk Prevention Plan 08/16/2020 08/14/2020  Medication Screening - Complete  Transportation Screening - Complete  Home Care Screening Complete -  Medication Review (RN CM) Complete -  Some recent data might be hidden

## 2020-08-18 LAB — CULTURE, BLOOD (ROUTINE X 2)
Culture: NO GROWTH
Culture: NO GROWTH

## 2020-08-20 DEATH — deceased

## 2022-02-09 IMAGING — DX DG CHEST 1V PORT
1 series · 1 of 1 positions shown · non-contrast
Comparison: 05/28/2020

CLINICAL DATA: Chest pain and weakness.

EXAM:
PORTABLE CHEST 1 VIEW

[chest ap]
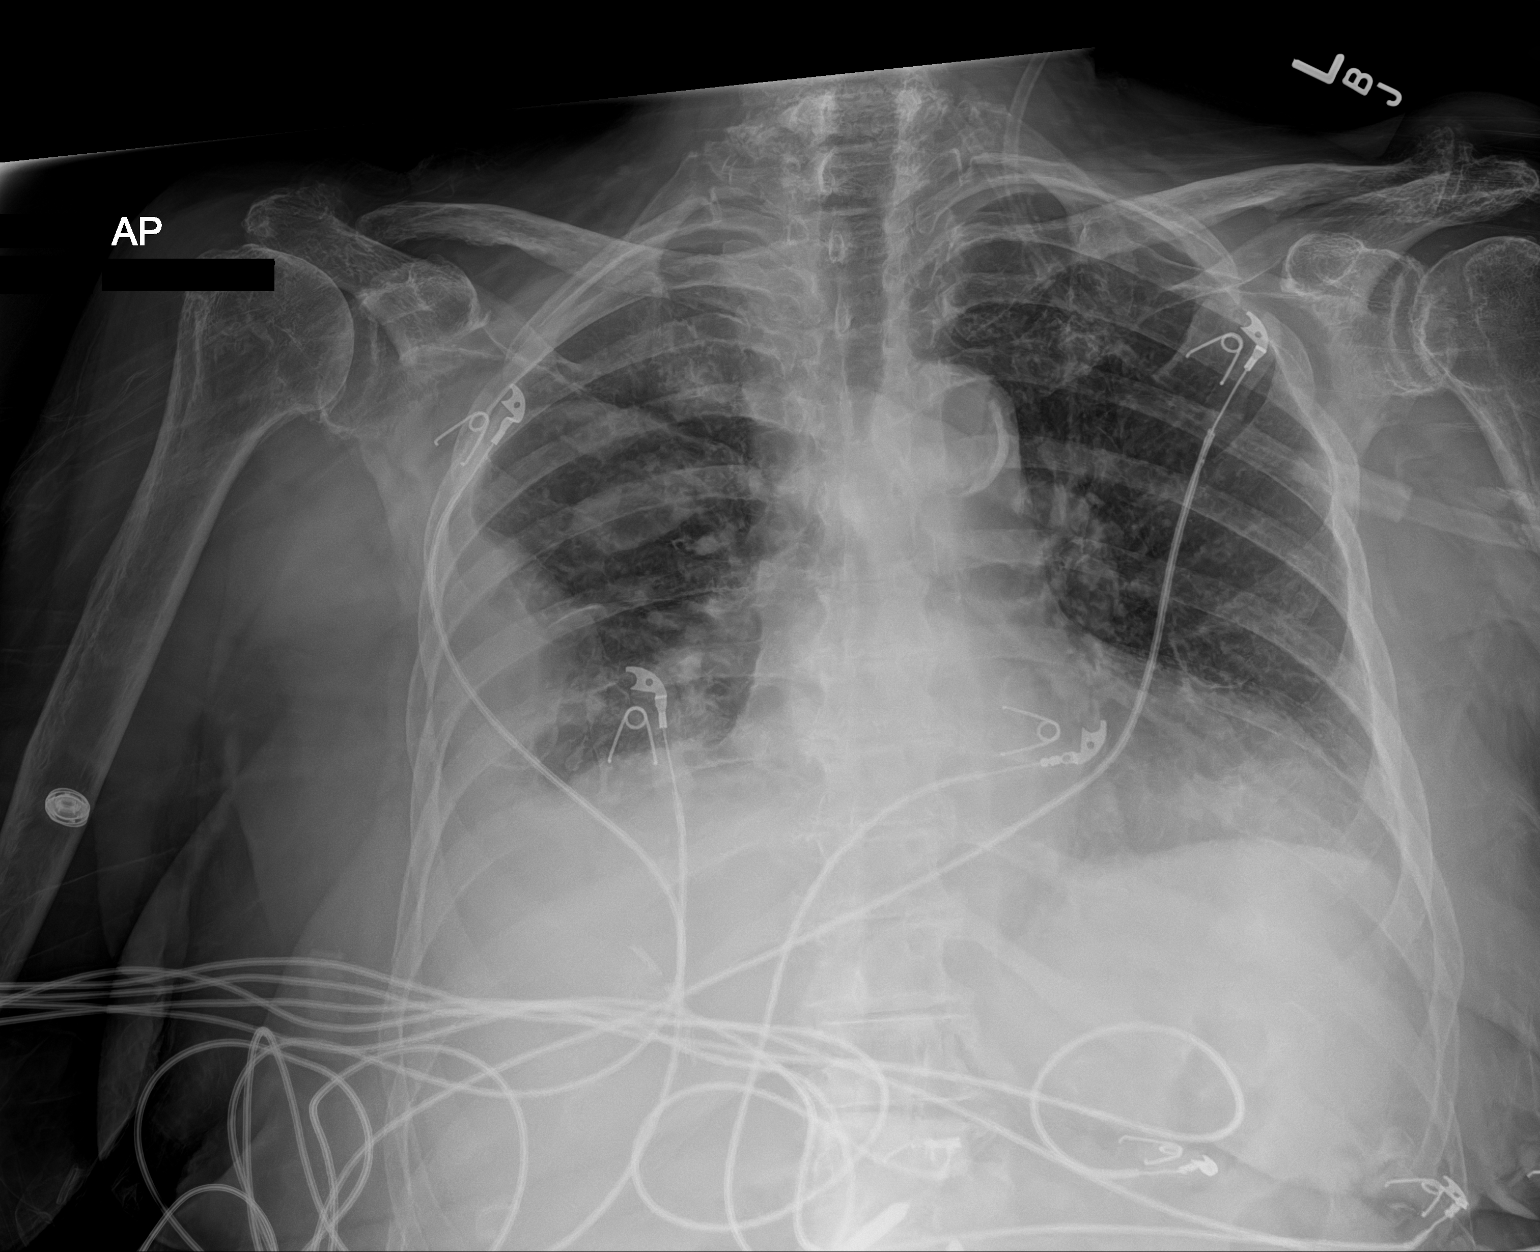

[1 of 1 positions shown; findings below may reference images not displayed]

FINDINGS: Normal heart size. Aortic atherosclerosis. There is a new opacity
overlying the right mid and lower lung concerning for loculated
pleural effusion. Fluid tracking along the major fissure is noted
within the perihilar right lung. Left lung appears clear.
IMPRESSION: New opacity overlying the right mid and lower lung concerning for
loculated pleural effusion.

## 2022-02-09 IMAGING — CT CT ANGIO CHEST
2 of 6 series · 17 of 46 positions shown · IV contrast (Omnipaque or Isovue)
Comparison: CT abdomen pelvis 11/21/2003.  Same day chest x-ray.

CLINICAL DATA: Chest pain, right flank pain, weakness

EXAM:
CT ANGIOGRAPHY CHEST
CT ABDOMEN AND PELVIS WITH CONTRAST
TECHNIQUE: Multidetector CT imaging of the chest was performed using the
standard protocol during bolus administration of intravenous
contrast. Multiplanar CT image reconstructions and MIPs were
obtained to evaluate the vascular anatomy. Multidetector CT imaging
of the abdomen and pelvis was performed using the standard protocol
during bolus administration of intravenous contrast.
CONTRAST:  100mL OMNIPAQUE IOHEXOL 350 MG/ML SOLN

[Series 3: pe axialthins · axial · 0.80mm/px · z∈[+9,+292]mm · 14 of 388 slices shown]
[im 17/388  lung]
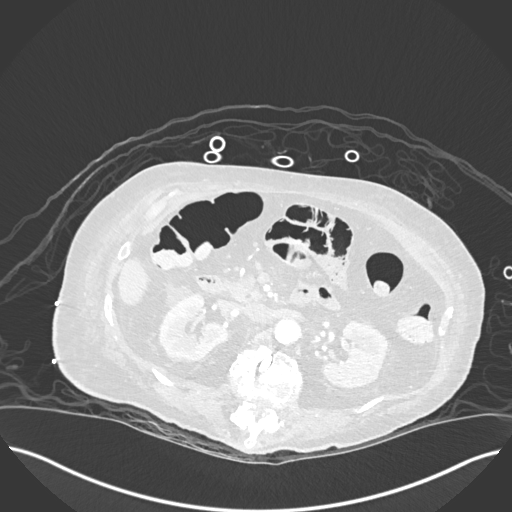
[im 49/388  soft-tissue]
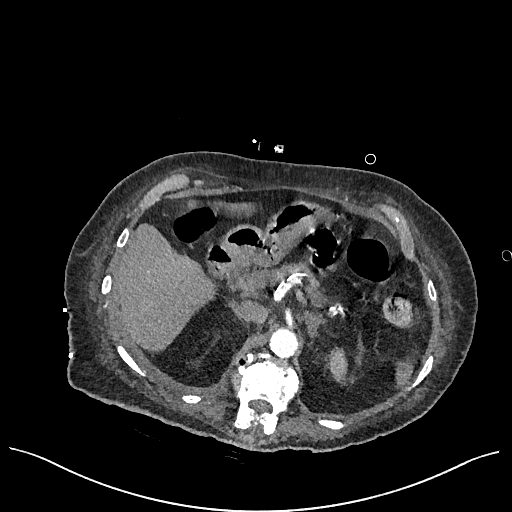
[im 81/388  lung]
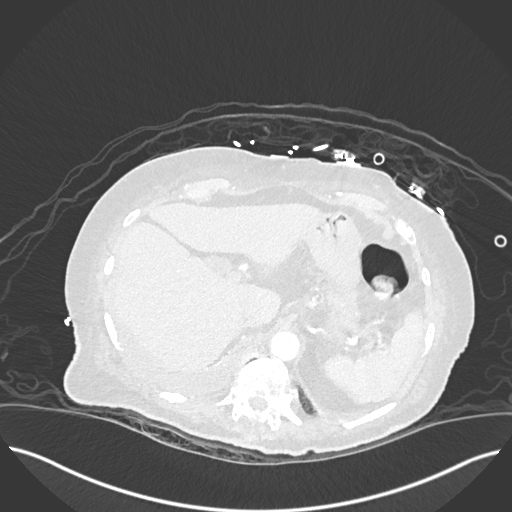
[im 97/388  soft-tissue]
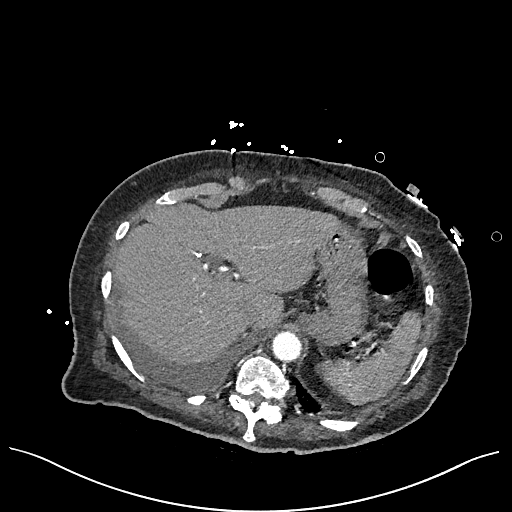
[im 130/388  lung]
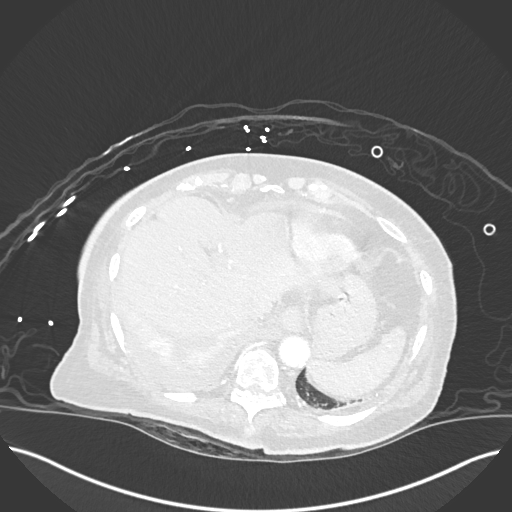
[im 162/388  soft-tissue]
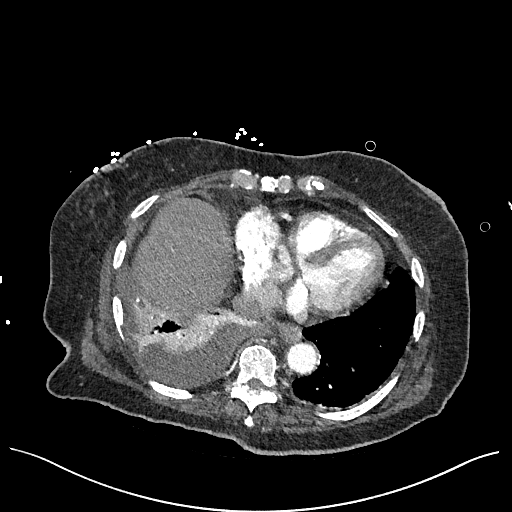
[im 178/388  lung]
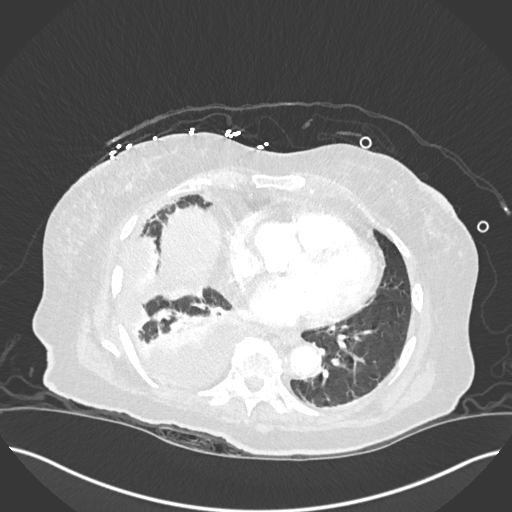
[im 210/388  soft-tissue]
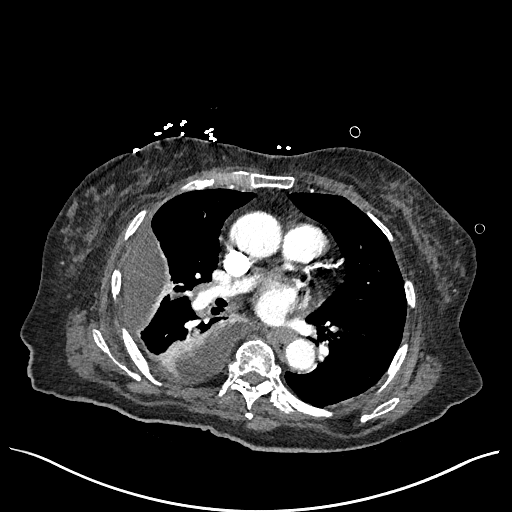
[im 226/388  lung]
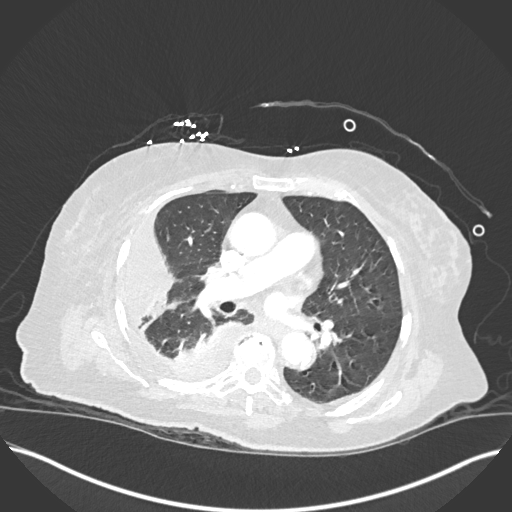
[im 259/388  soft-tissue]
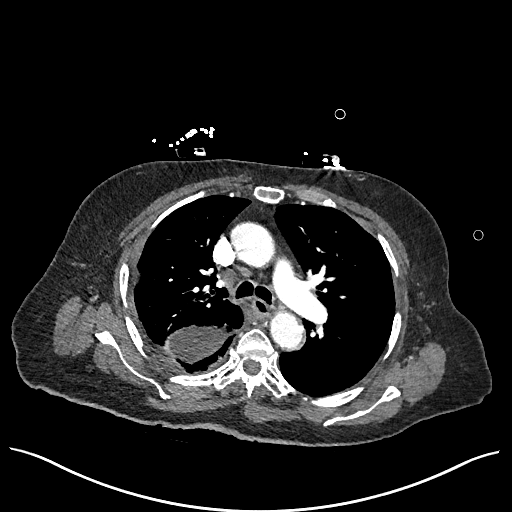
[im 291/388  lung]
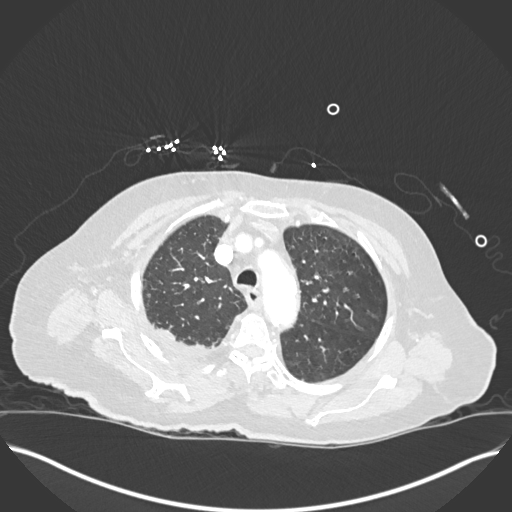
[im 307/388  soft-tissue]
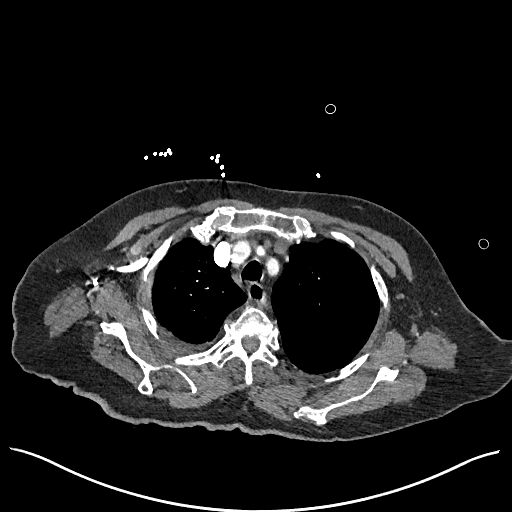
[im 339/388  lung]
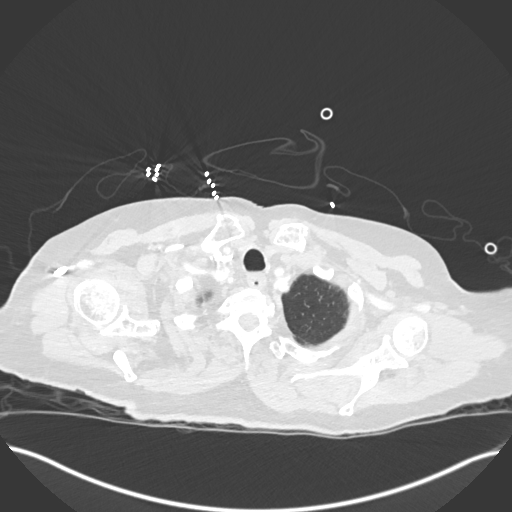
[im 371/388  soft-tissue]
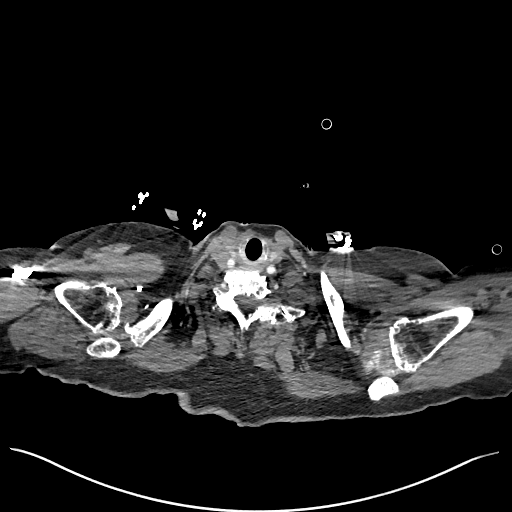

[Series 6: cor soft · coronal · 0.73mm/px · 3 of 151 slices shown]
[im 38/151  soft-tissue]
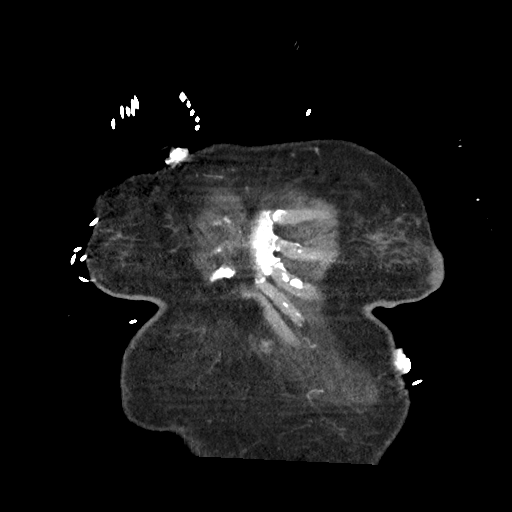
[im 76/151  soft-tissue]
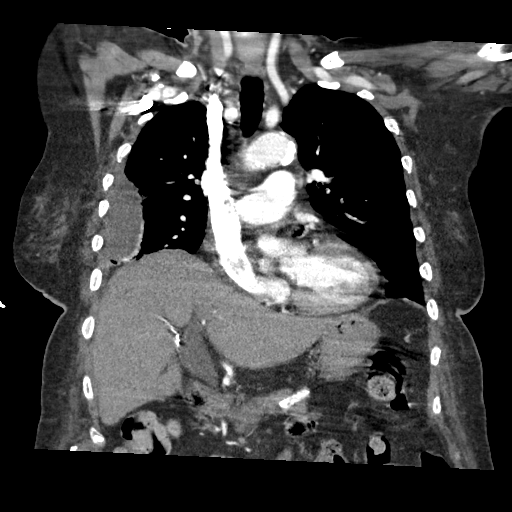
[im 113/151  soft-tissue]
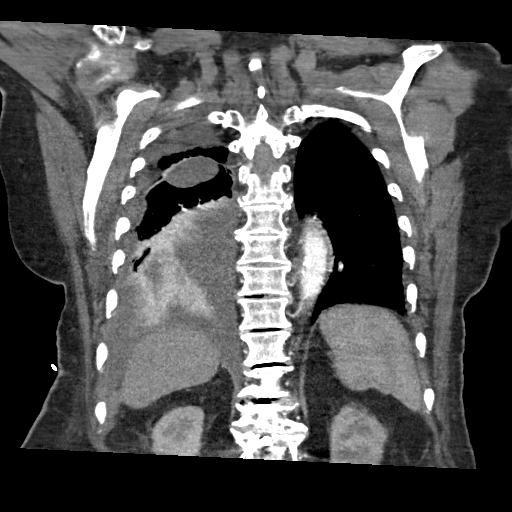

[17 of 46 positions shown; findings below may reference images not displayed]

FINDINGS: CTA CHEST FINDINGS

Cardiovascular: Satisfactory opacification of pulmonary arteries. No
filling defect evident to the segmental branch level to suggest
pulmonary embolism. Main pulmonary trunk is normal in caliber.
Thoracic aorta is nonaneurysmal. Atherosclerotic calcifications of
the aorta and coronary arteries. Heart size within normal limits. No
pericardial effusion.

Mediastinum/Nodes: No axillary, mediastinal, or hilar
lymphadenopathy. Thyroid, trachea, and esophagus within normal
limits.

Lungs/Pleura: Small to moderate sized loculated pleural effusion
along the posterior and lateral aspects of the right hemithorax.
Fluid also tracks into the minor fissure. There is associated
compressive atelectasis within the right lower lobe. Left lung is
clear. No left-sided pleural effusion. No pneumothorax.

Musculoskeletal: Chronic displaced posterior left twelfth rib
fracture. No acute osseous findings. No chest wall abnormality.

Review of the MIP images confirms the above findings.

CT ABDOMEN and PELVIS FINDINGS

Hepatobiliary: No focal liver abnormality is seen. Status post
cholecystectomy. No biliary dilatation.

Pancreas: Unremarkable. No pancreatic ductal dilatation or
surrounding inflammatory changes.

Spleen: Normal in size without focal abnormality.

Adrenals/Urinary Tract: Unremarkable adrenal glands. Two right renal
cysts are noted. Kidneys have otherwise symmetric enhancement. There
is excreted contrast within the bilateral renal collecting systems.
No definite renal stone. No hydronephrosis. Bilateral ureters are
unremarkable. Urinary bladder within normal limits.

Stomach/Bowel: Stomach is within normal limits. Scattered colonic
diverticulosis. No evidence of bowel wall thickening, distention, or
inflammatory changes.

Vascular/Lymphatic: Atherosclerotic calcification throughout the
aortoiliac axis. No aneurysm. No abdominopelvic lymphadenopathy.

Reproductive: Status post hysterectomy. No adnexal masses.

Other: No free fluid. No abdominopelvic fluid collection. No
pneumoperitoneum.

Musculoskeletal: Prior lumbar fusion. Mild perihardware lucency
associated with the right transpedicular screw at L1 (series 8,
image 78). Advanced adjacent segment disease at T12-L1. Severe
arthropathy of the right hip. No acute osseous findings.

Review of the MIP images confirms the above findings.
IMPRESSION: 1. No evidence of pulmonary embolism.
2. Small-to-moderate loculated right-sided pleural effusion with
associated compressive atelectasis.
3. No acute findings within the abdomen or pelvis.
4. Prior lumbar fusion with mild perihardware lucency associated
with the right transpedicular screw at L1, suggestive of loosening.
5. Colonic diverticulosis without evidence of diverticulitis.
6. Aortic and coronary artery atherosclerosis ([id number]).
